# Patient Record
Sex: Male | Born: 1965 | Race: White | Hispanic: No | Marital: Married | Smoking: Never smoker
Health system: Southern US, Community
[De-identification: ages and names within clinical notes are randomized; demographics above are authoritative.]

## PROBLEM LIST (undated history)

## (undated) DIAGNOSIS — E079 Disorder of thyroid, unspecified: Secondary | ICD-10-CM

## (undated) DIAGNOSIS — F419 Anxiety disorder, unspecified: Secondary | ICD-10-CM

## (undated) DIAGNOSIS — K219 Gastro-esophageal reflux disease without esophagitis: Secondary | ICD-10-CM

## (undated) HISTORY — DX: Gastro-esophageal reflux disease without esophagitis: K21.9

## (undated) HISTORY — PX: NOSE SURGERY: SHX723

## (undated) HISTORY — PX: THYROID SURGERY: SHX805

## (undated) HISTORY — DX: Disorder of thyroid, unspecified: E07.9

## (undated) HISTORY — DX: Anxiety disorder, unspecified: F41.9

---

## 1987-08-22 HISTORY — PX: TONSILLECTOMY: SUR1361

## 2007-06-11 ENCOUNTER — Ambulatory Visit: Payer: Self-pay | Admitting: Otolaryngology

## 2007-06-12 ENCOUNTER — Ambulatory Visit: Payer: Self-pay | Admitting: Otolaryngology

## 2007-08-05 ENCOUNTER — Ambulatory Visit: Payer: Self-pay | Admitting: Otolaryngology

## 2007-09-30 ENCOUNTER — Ambulatory Visit: Payer: Self-pay | Admitting: Otolaryngology

## 2007-11-11 ENCOUNTER — Ambulatory Visit: Payer: Self-pay | Admitting: Otolaryngology

## 2007-11-15 ENCOUNTER — Ambulatory Visit: Payer: Self-pay | Admitting: Otolaryngology

## 2011-08-22 HISTORY — PX: KNEE ARTHROSCOPY: SHX127

## 2012-01-23 ENCOUNTER — Ambulatory Visit: Payer: Self-pay | Admitting: Sports Medicine

## 2012-02-29 ENCOUNTER — Ambulatory Visit: Payer: Self-pay | Admitting: Family Medicine

## 2012-03-08 ENCOUNTER — Ambulatory Visit: Payer: Self-pay | Admitting: General Practice

## 2013-09-28 ENCOUNTER — Ambulatory Visit: Payer: Self-pay | Admitting: Physician Assistant

## 2014-06-08 ENCOUNTER — Ambulatory Visit: Payer: Self-pay | Admitting: Family Medicine

## 2014-10-25 ENCOUNTER — Ambulatory Visit: Payer: Self-pay | Admitting: Emergency Medicine

## 2014-11-20 HISTORY — PX: SHOULDER SURGERY: SHX246

## 2014-12-08 NOTE — Op Note (Signed)
PATIENT NAME:  Timothy Hines, Timothy Hines MR#:  [id number] DATE OF BIRTH:  1965-09-02  DATE OF PROCEDURE:  03/08/2012  PREOPERATIVE DIAGNOSIS: Internal derangement of the left knee.   POSTOPERATIVE DIAGNOSES:  1. Tear of the posterior horn of medial meniscus, left knee.  2. 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   PROCEDURES PERFORMED: Left knee arthroscopy, partial medial meniscectomy, and chondroplasty of the medial and patellofemoral articulation.   SURGEON: Illene LabradorJames P. Hooten, MD  ANESTHESIA: General.   ESTIMATED BLOOD LOSS: Minimal.   TOURNIQUET TIME: Not used.   DRAINS: None.   INDICATIONS FOR SURGERY: The patient is a 49 year old male who has had complaints of left knee pain and swelling. MRI demonstrated findings suggestive of meniscal pathology. After discussion of the risks and benefits of surgical intervention, the patient expressed his understanding of the risks and benefits and agreed with plans for surgical intervention.   PROCEDURE IN DETAIL: The patient brought into the Operating Room and, after adequate general anesthesia was achieved, a tourniquet was placed on the patient's left thigh and the leg was placed in a leg holder. All bony prominences were well padded. The patient's left knee and leg were cleaned and prepped with alcohol and DuraPrep and draped in the usual sterile fashion. A "time out" was performed as per usual protocol. The anticipated portal sites were injected with 0.25% Marcaine with epinephrine. An anterolateral portal was created and cannula was inserted. A large effusion was evacuated. The scope was inserted and the knee was distended with fluid using the DePuy Mitek pump. The scope was advanced down the medial gutter into the medial compartment of the knee. Under visualization with the scope, an anteromedial portal was created and hook probe was inserted.  Inspection of the medial compartment demonstrated a small tear of the posterior horn medial meniscus. The tear was debrided using meniscal punches and a 4.5 mm shaver. Final contouring was performed using a 50 degree ArthroCare wand. The anterior horn of the medial meniscus was visualized and probed and felt to be stable. Articular surface along the distal aspect of the medial femoral condyle was in good condition as was the medial tibial plateau. However, with flexion of the knee there was noted to be an area of significant grade III chondromalacia. The area was debrided and contoured using the 50 degree ArthroCare wand. The scope was then advanced into the intercondylar region. The anterior cruciate ligament was visualized and probed and felt to be stable. The scope was removed from the anterolateral portal and reinserted via the anteromedial portal so as to better visualize the lateral compartment. The articular surface of the lateral compartment was in excellent condition. The lateral meniscus was visualized and probed and felt to be stable. Finally, the scope was positioned so as to visualize the patellofemoral articulation. Some grade III changes were noted along the trochlear groove with grade IV changes noted to the lateral facet of the patella. There was also noted to be an osteochondral loose body along the lateral margin of the patella. The articular surface was debrided and contoured using the ArthroCare wand. A pituitary rongeur was used to remove the loose body.   The knee was irrigated with copious amounts of fluid and then suctioned dry. The anterolateral portal was reapproximated using #3-0 nylon. A combination of 0.25% Marcaine with epinephrine and 4 mg of morphine was injected via the scope. The scope was removed and the anteromedial  portal was reapproximated using #3-0 nylon. Sterile dressing was applied followed by application of an ice wrap.   The patient tolerated the procedure well. He  was transported to the recovery room in stable condition.  ____________________________ Illene Labrador. Angie Fava., MD jph:slb D: 03/08/2012 12:41:28 ET     T: 03/08/2012 12:57:21 ET        JOB#: [id number] cc: Fayrene Fearing P. Angie Fava., MD, <Dictator> JAMES P Angie Fava MD ELECTRONICALLY SIGNED 03/10/2012 8:12

## 2015-07-01 ENCOUNTER — Encounter: Payer: Self-pay | Admitting: Family Medicine

## 2015-07-12 ENCOUNTER — Encounter: Payer: Self-pay | Admitting: Family Medicine

## 2015-07-12 ENCOUNTER — Ambulatory Visit (INDEPENDENT_AMBULATORY_CARE_PROVIDER_SITE_OTHER): Payer: BC Managed Care – PPO | Admitting: Family Medicine

## 2015-07-12 VITALS — BP 142/80 | HR 79 | Temp 98.6°F | Resp 16 | Ht 68.0 in | Wt 219.4 lb

## 2015-07-12 DIAGNOSIS — Z0000 Encounter for general adult medical examination without abnormal findings: Secondary | ICD-10-CM

## 2015-07-12 DIAGNOSIS — E039 Hypothyroidism, unspecified: Secondary | ICD-10-CM | POA: Diagnosis not present

## 2015-07-12 DIAGNOSIS — L989 Disorder of the skin and subcutaneous tissue, unspecified: Secondary | ICD-10-CM

## 2015-07-12 DIAGNOSIS — E890 Postprocedural hypothyroidism: Secondary | ICD-10-CM | POA: Insufficient documentation

## 2015-07-12 DIAGNOSIS — F419 Anxiety disorder, unspecified: Secondary | ICD-10-CM | POA: Diagnosis not present

## 2015-07-12 DIAGNOSIS — N529 Male erectile dysfunction, unspecified: Secondary | ICD-10-CM

## 2015-07-12 DIAGNOSIS — Z23 Encounter for immunization: Secondary | ICD-10-CM | POA: Diagnosis not present

## 2015-07-12 DIAGNOSIS — E291 Testicular hypofunction: Secondary | ICD-10-CM | POA: Diagnosis not present

## 2015-07-12 DIAGNOSIS — J40 Bronchitis, not specified as acute or chronic: Secondary | ICD-10-CM | POA: Diagnosis not present

## 2015-07-12 MED ORDER — AMOXICILLIN-POT CLAVULANATE 875-125 MG PO TABS: 1.0000 | ORAL_TABLET | Freq: Two times a day (BID) | ORAL | Status: DC

## 2015-07-12 MED ORDER — MELOXICAM 15 MG PO TABS: 15.0000 mg | ORAL_TABLET | Freq: Every day | ORAL | Status: DC

## 2015-07-12 MED ORDER — HYDROCOD POLST-CPM POLST ER 10-8 MG/5ML PO SUER: 5.0000 mL | Freq: Every evening | ORAL | Status: DC | PRN

## 2015-07-12 MED ORDER — ALPRAZOLAM 0.5 MG PO TABS: 0.5000 mg | ORAL_TABLET | Freq: Two times a day (BID) | ORAL | Status: DC | PRN

## 2015-07-12 MED ORDER — TESTOSTERONE CYPIONATE 200 MG/ML IM SOLN: 200.0000 mg | INTRAMUSCULAR | Status: DC

## 2015-07-12 MED ORDER — OMEPRAZOLE 40 MG PO CPDR: 40.0000 mg | DELAYED_RELEASE_CAPSULE | Freq: Every day | ORAL | Status: DC

## 2015-07-12 NOTE — Progress Notes (Signed)
Name: Timothy Hines   MRN: [id number]    DOB: 1965/10/10   Date:07/12/2015       Progress Note  Subjective  Chief Complaint  Chief Complaint  Patient presents with  . Annual Exam    HPI  Patient presents for annual H&P at the age of 89.   Hypogonadism  Patient has a long history of hypogonadism haven't after having used anabolic steroids during his years of muscle building much in the past. He currently is on testosterone cypionate on a regimen of approximately 100 g every other week.   ED  Patient on generic sildenafil 20 mg.. He takes on a when necessary basis and uses about 30 tabs per month.  Facial lesion  Patient complains of a lesion on his right forehead area which she seen for several months. Also several other pigmented lesions on his back.  Bronchitis  Patient presents with a 1/2-2 week history of cough productive of green sputum as well as nasal congestion and drainage. He caught it on was vacationing. He's had no fever or chills. Over-the-counter meds have not been effective. He hasn't Delsym during the day but still coughs throughout the night.  Anxiety  Patient takes half of a Xanax on nights when he has a long day at work. This is working effectively to relieve his anxiety and insomnia police long work days.Anxiety has been a problem for over 5 years. It is mainly manifested by insomnia and some sweaty palms and racing thoughts at times.  Hypothyroidism  Patient is being [facility] for his hypothyroidism is currently on levothyroxine 25 g.    Past Medical History  Diagnosis Date  . Anxiety   . GERD (gastroesophageal reflux disease)   . Thyroid disease     Social History  Substance Use Topics  . Smoking status: Never Smoker   . Smokeless tobacco: Not on file  . Alcohol Use: No     Current outpatient prescriptions:  .  ALPRAZolam (XANAX) 0.5 MG tablet, Take 0.5 mg by mouth., Disp: , Rfl:  .  meloxicam (MOBIC) 15 MG  tablet, Take 1 tablet (15 mg total) by mouth daily., Disp: 30 tablet, Rfl: 5 .  omeprazole (PRILOSEC) 40 MG capsule, Take 1 capsule (40 mg total) by mouth daily., Disp: 30 capsule, Rfl: 5 .  tadalafil (CIALIS) 20 MG tablet, Take 20 mg by mouth daily as needed for erectile dysfunction., Disp: , Rfl:  .  testosterone cypionate (DEPOTESTOSTERONE CYPIONATE) 200 MG/ML injection, Inject 200 mg into the muscle every 14 (fourteen) days. 3/4 cc q week, Disp: , Rfl:  .  levothyroxine (SYNTHROID, LEVOTHROID) 125 MCG tablet, , Disp: , Rfl:  .  levothyroxine (SYNTHROID, LEVOTHROID) 150 MCG tablet, , Disp: , Rfl:  .  mometasone (NASONEX) 50 MCG/ACT nasal spray, , Disp: , Rfl:   No Known Allergies  Review of Systems  Constitutional: Positive for malaise/fatigue. Negative for fever, chills and weight loss.  HENT: Positive for congestion and sore throat. Negative for hearing loss and tinnitus.   Eyes: Negative for blurred vision, double vision and redness.  Respiratory: Positive for cough and sputum production. Negative for hemoptysis and shortness of breath.   Cardiovascular: Negative for chest pain, palpitations, orthopnea, claudication and leg swelling.  Gastrointestinal: Positive for nausea. Negative for heartburn, vomiting, diarrhea, constipation and blood in stool.  Genitourinary: Negative for dysuria, urgency, frequency and hematuria.       Erectile dysfunction  Musculoskeletal: Positive for joint pain. Negative for  myalgias, back pain, falls and neck pain.  Skin: Positive for rash. Negative for itching.  Neurological: Negative for dizziness, tingling, tremors, focal weakness, seizures, loss of consciousness, weakness and headaches.  Endo/Heme/Allergies: Does not bruise/bleed easily.  Psychiatric/Behavioral: Negative for depression and substance abuse. The patient is nervous/anxious and has insomnia.      Objective  Filed Vitals:   07/12/15 1329  BP: 142/80  Pulse: 79  Temp: 98.6 F (37 C)   TempSrc: Oral  Resp: 16  Height:  (1.727 m)  Weight: 219 lb 6.4 oz (99.519 kg)  SpO2: 97%     Physical Exam  Constitutional: He is oriented to person, place, and time and well-developed, well-nourished, and in no distress.  HENT:  Erythematous nasal turbinates with slightly purulent discharge oropharynx is injected as well.  Eyes: EOM are normal. Pupils are equal, round, and reactive to light.  Neck: Normal range of motion. Neck supple. No thyromegaly present.  Cardiovascular: Normal rate, regular rhythm and normal heart sounds.   No murmur heard. Pulmonary/Chest: Effort normal and breath sounds normal. No respiratory distress. He has no wheezes.  Chest wall tenderness over the left pectoralis area site of former surgery. There's for range of motion shoulder joint  Abdominal: Soft. Bowel sounds are normal.  Genitourinary: Rectum normal, prostate normal and penis normal. Guaiac negative stool. No discharge found.  Musculoskeletal: Normal range of motion. He exhibits tenderness. He exhibits no edema.  Over the left shoulder and pectoralis area there's some mild tenderness  Lymphadenopathy:    He has no cervical adenopathy.  Neurological: He is alert and oriented to person, place, and time. No cranial nerve deficit. Gait normal. Coordination normal.  Skin: Skin is warm and dry. No rash noted.  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      Assessment & Plan  1. Annual physical exam  - CBC - Lipid panel  2. Hypothyroidism, unspecified hypothyroidism type  - PSA - Testosterone  3. Hypogonadism in male  - PSA - Testosterone  4. Acute anxiety Renew Xanax when necessary  5. Erectile dysfunction, unspecified erectile dysfunction type Renew sildenafil - PSA  6. Skin lesion of face Refer to dermatologist  7. Bronchitis Tussionex and  Augmentin  8. Need for influenza vaccination - Flu Vaccine QUAD 36+ mos PF IM (Fluarix & Fluzone Quad PF)

## 2015-07-13 LAB — CBC
HEMATOCRIT: 48 % (ref 37.5–51.0)
Hemoglobin: 16.5 g/dL (ref 12.6–17.7)
MCH: 30.8 pg (ref 26.6–33.0)
MCHC: 34.4 g/dL (ref 31.5–35.7)
MCV: 90 fL (ref 79–97)
PLATELETS: 272 10*3/uL (ref 150–379)
RBC: 5.36 x10E6/uL (ref 4.14–5.80)
RDW: 13.7 % (ref 12.3–15.4)
WBC: 4.5 10*3/uL (ref 3.4–10.8)

## 2015-07-14 LAB — LIPID PANEL
CHOL/HDL RATIO: 4.9 ratio (ref 0.0–5.0)
Cholesterol, Total: 230 mg/dL — ABNORMAL HIGH (ref 100–199)
HDL: 47 mg/dL (ref >39)
LDL CALC: 161 mg/dL — AB (ref 0–99)
TRIGLYCERIDES: 111 mg/dL (ref 0–149)
VLDL Cholesterol Cal: 22 mg/dL (ref 5–40)

## 2015-07-14 LAB — TESTOSTERONE: Testosterone: 373 ng/dL (ref 348–1197)

## 2015-07-14 LAB — PSA: Prostate Specific Ag, Serum: 1 ng/mL (ref 0.0–4.0)

## 2015-08-24 ENCOUNTER — Telehealth: Payer: Self-pay | Admitting: Family Medicine

## 2015-08-24 NOTE — Telephone Encounter (Signed)
Pt states he came in Nov for his CPE and has not received his results for his blood work. Please return his call.

## 2015-08-25 ENCOUNTER — Telehealth: Payer: Self-pay | Admitting: Family Medicine

## 2015-08-25 NOTE — Telephone Encounter (Signed)
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 PLEASE CALL AND GIVE HIM HIS RESULTS.

## 2015-08-26 ENCOUNTER — Encounter: Payer: Self-pay | Admitting: Family Medicine

## 2015-08-26 ENCOUNTER — Ambulatory Visit (INDEPENDENT_AMBULATORY_CARE_PROVIDER_SITE_OTHER): Payer: BC Managed Care – PPO | Admitting: Family Medicine

## 2015-08-26 VITALS — BP 112/72 | HR 78 | Temp 98.6°F | Resp 18 | Ht 68.0 in | Wt 215.5 lb

## 2015-08-26 DIAGNOSIS — J029 Acute pharyngitis, unspecified: Secondary | ICD-10-CM | POA: Diagnosis not present

## 2015-08-26 DIAGNOSIS — J0100 Acute maxillary sinusitis, unspecified: Secondary | ICD-10-CM

## 2015-08-26 DIAGNOSIS — J40 Bronchitis, not specified as acute or chronic: Secondary | ICD-10-CM

## 2015-08-26 LAB — POCT RAPID STREP A (OFFICE): RAPID STREP A SCREEN: NEGATIVE

## 2015-08-26 MED ORDER — DEXTROMETHORPHAN POLISTIREX ER 30 MG/5ML PO SUER: 30.0000 mg | Freq: Two times a day (BID) | ORAL | Status: DC

## 2015-08-26 MED ORDER — FLUTICASONE PROPIONATE 50 MCG/ACT NA SUSP: 2.0000 | Freq: Every day | NASAL | Status: DC

## 2015-08-26 MED ORDER — AMOXICILLIN-POT CLAVULANATE 875-125 MG PO TABS: 1.0000 | ORAL_TABLET | Freq: Two times a day (BID) | ORAL | Status: DC

## 2015-08-26 MED ORDER — PREDNISONE 20 MG PO TABS: 20.0000 mg | ORAL_TABLET | Freq: Every day | ORAL | Status: DC

## 2015-08-26 NOTE — Progress Notes (Signed)
Name: Timothy Hines   MRN: [id number]    DOB: Jan 11, 1966   Date:08/26/2015       Progress Note  Subjective  Chief Complaint  Chief Complaint  Patient presents with  . URI    for 1 week, cough, congested  . Sore Throat    HPI  Sinusitis  Patient presents with greater than 7 day history of nasal congestion and drainage which is purulent in color. There is tenderness over the sinuses. There has been fever to - along with some associated chills on occasion. Usage of over-the-counter medications is not been affected. There is also accompanying cough productive of purulent sputum.  Bronchitis  Patient presents with a greater than 1 week history of cough productive of purulent sputum. The cough is irritating and keep the patient awake at night. There has no fever or chills.  Over-the-counter meds And completely effective.  Pharyngitis  Several-day history of sore throat pain with cough and congestion. No definite exposure to strep but he does have small children at home.  Flulike syndrome  Several-day history of congestion cough and subjective fever chills myalgias. Has several small children at home  Past Medical History  Diagnosis Date  . Anxiety   . GERD (gastroesophageal reflux disease)   . Thyroid disease     Social History  Substance Use Topics  . Smoking status: Never Smoker   . Smokeless tobacco: Not on file  . Alcohol Use: No     Current outpatient prescriptions:  .  ALPRAZolam (XANAX) 0.5 MG tablet, Take 1 tablet (0.5 mg total) by mouth 2 (two) times daily as needed for anxiety., Disp: 60 tablet, Rfl: 5 .  chlorpheniramine-HYDROcodone (TUSSIONEX PENNKINETIC ER) 10-8 MG/5ML SUER, Take 5 mLs by mouth at bedtime as needed for cough., Disp: 140 mL, Rfl: 0 .  levothyroxine (SYNTHROID, LEVOTHROID) 125 MCG tablet, , Disp: , Rfl:  .  levothyroxine (SYNTHROID, LEVOTHROID) 150 MCG tablet, , Disp: , Rfl:  .  meloxicam (MOBIC) 15 MG tablet, Take 1 tablet (15 mg total)  by mouth daily., Disp: 30 tablet, Rfl: 5 .  mometasone (NASONEX) 50 MCG/ACT nasal spray, , Disp: , Rfl:  .  omeprazole (PRILOSEC) 40 MG capsule, Take 1 capsule (40 mg total) by mouth daily., Disp: 30 capsule, Rfl: 5 .  tadalafil (CIALIS) 20 MG tablet, Take 20 mg by mouth daily as needed for erectile dysfunction., Disp: , Rfl:  .  testosterone cypionate (DEPOTESTOSTERONE CYPIONATE) 200 MG/ML injection, Inject 1 mL (200 mg total) into the muscle every 14 (fourteen) days. 3/4 cc q week, Disp: 10 mL, Rfl: 5  No Known Allergies  Review of Systems  Constitutional: Positive for fever, chills and malaise/fatigue. Negative for weight loss.  HENT: Positive for congestion and sore throat. Negative for hearing loss and tinnitus.   Eyes: Negative for blurred vision, double vision and redness.  Respiratory: Positive for cough and sputum production. Negative for hemoptysis and shortness of breath.   Cardiovascular: Negative for chest pain, palpitations, orthopnea, claudication and leg swelling.  Gastrointestinal: Negative for heartburn, nausea, vomiting, diarrhea, constipation and blood in stool.  Genitourinary: Negative for dysuria, urgency, frequency and hematuria.  Musculoskeletal: Negative for myalgias, back pain, joint pain, falls and neck pain.  Skin: Negative for itching.  Neurological: Negative for dizziness, tingling, tremors, focal weakness, seizures, loss of consciousness, weakness and headaches.  Endo/Heme/Allergies: Does not bruise/bleed easily.  Psychiatric/Behavioral: Negative for depression and substance abuse. The patient is not nervous/anxious and does not have insomnia.  Objective  Filed Vitals:   08/26/15 1136  BP: 112/72  Pulse: 78  Temp: 98.6 F (37 C)  TempSrc: Oral  Resp: 18  Height: 5\' 8"  (1.727 m)  Weight: 215 lb 8 oz (97.75 kg)  SpO2: 97%     Physical Exam  Constitutional: He is oriented to person, place, and time and well-developed, well-nourished, and in no  distress.  HENT:  Head: Normocephalic.  Tender over the frontal maxillary sinuses with nasal turbinate swelling and erythema with purulent discharge pharynx minimally injected cervical lymph nodes are tender  Eyes: EOM are normal. Pupils are equal, round, and reactive to light.  Neck: Normal range of motion. Neck supple. No thyromegaly present.  Cardiovascular: Normal rate, regular rhythm and normal heart sounds.   No murmur heard. Pulmonary/Chest: Effort normal and breath sounds normal. No respiratory distress. He has no wheezes.  Abdominal: Soft. Bowel sounds are normal.  Musculoskeletal: Normal range of motion. He exhibits no edema.  Lymphadenopathy:    He has no cervical adenopathy.  Neurological: He is alert and oriented to person, place, and time. No cranial nerve deficit. Gait normal. Coordination normal.  Skin: Skin is warm and dry. No rash noted.  Psychiatric: Affect and judgment normal.      Assessment & Plan  1. Sore throat Probably secondary to nasal drainage from sinusitis - POCT rapid strep A  2. Bronchitis Persisting - amoxicillin-clavulanate (AUGMENTIN) 875-125 MG tablet; Take 1 tablet by mouth 2 (two) times daily.  Dispense: 20 tablet; Refill: 0 - predniSONE (DELTASONE) 20 MG tablet; Take 1 tablet (20 mg total) by mouth daily with breakfast.  Dispense: 10 tablet; Refill: 0 - dextromethorphan (DELSYM) 30 MG/5ML liquid; Take 5 mLs (30 mg total) by mouth 2 (two) times daily.  Dispense: 89 mL; Refill: 0  3. Acute maxillary sinusitis, recurrence not specified Moderate - amoxicillin-clavulanate (AUGMENTIN) 875-125 MG tablet; Take 1 tablet by mouth 2 (two) times daily.  Dispense: 20 tablet; Refill: 0 - predniSONE (DELTASONE) 20 MG tablet; Take 1 tablet (20 mg total) by mouth daily with breakfast.  Dispense: 10 tablet; Refill: 0 - fluticasone (FLONASE) 50 MCG/ACT nasal spray; Place 2 sprays into both nostrils daily.  Dispense: 16 g; Refill: 6

## 2015-08-26 NOTE — Telephone Encounter (Signed)
Patient notified of labs at appointment  

## 2015-08-27 ENCOUNTER — Ambulatory Visit: Payer: BC Managed Care – PPO | Admitting: Family Medicine

## 2015-12-31 ENCOUNTER — Encounter: Payer: Self-pay | Admitting: Emergency Medicine

## 2015-12-31 ENCOUNTER — Ambulatory Visit
Admission: EM | Admit: 2015-12-31 | Discharge: 2015-12-31 | Disposition: A | Discharge status: Home / Self Care | Payer: BC Managed Care – PPO | Attending: Family Medicine | Admitting: Family Medicine

## 2015-12-31 DIAGNOSIS — R05 Cough: Secondary | ICD-10-CM

## 2015-12-31 DIAGNOSIS — J0100 Acute maxillary sinusitis, unspecified: Secondary | ICD-10-CM

## 2015-12-31 DIAGNOSIS — J029 Acute pharyngitis, unspecified: Secondary | ICD-10-CM | POA: Diagnosis not present

## 2015-12-31 DIAGNOSIS — R059 Cough, unspecified: Secondary | ICD-10-CM

## 2015-12-31 LAB — RAPID STREP SCREEN (MED CTR MEBANE ONLY): STREPTOCOCCUS, GROUP A SCREEN (DIRECT): NEGATIVE

## 2015-12-31 MED ORDER — HYDROCOD POLST-CPM POLST ER 10-8 MG/5ML PO SUER: 5.0000 mL | Freq: Two times a day (BID) | ORAL | Status: DC | PRN

## 2015-12-31 MED ORDER — FEXOFENADINE-PSEUDOEPHED ER 180-240 MG PO TB24: 1.0000 | ORAL_TABLET | Freq: Every day | ORAL | Status: DC

## 2015-12-31 MED ORDER — CEFUROXIME AXETIL 500 MG PO TABS: 500.0000 mg | ORAL_TABLET | Freq: Two times a day (BID) | ORAL | Status: DC

## 2015-12-31 NOTE — ED Notes (Signed)
Patient c/o sore throat, left ear pain and cough for a week.

## 2015-12-31 NOTE — Discharge Instructions (Signed)
Pharyngitis Pharyngitis is a sore throat (pharynx). There is redness, pain, and swelling of your throat. HOME CARE   Drink enough fluids to keep your pee (urine) clear or pale yellow.  Only take medicine as told by your doctor.  You may get sick again if you do not take medicine as told. Finish your medicines, even if you start to feel better.  Do not take aspirin.  Rest.  Rinse your mouth (gargle) with salt water ( tsp of salt per 1 qt of water) every 1-2 hours. This will help the pain.  If you are not at risk for choking, you can suck on hard candy or sore throat lozenges. GET HELP IF:  You have large, tender lumps on your neck.  You have a rash.  You cough up green, yellow-brown, or bloody spit. GET HELP RIGHT AWAY IF:   You have a stiff neck.  You drool or cannot swallow liquids.  You throw up (vomit) or are not able to keep medicine or liquids down.  You have very bad pain that does not go away with medicine.  You have problems breathing (not from a stuffy nose). MAKE SURE YOU:   Understand these instructions.  Will watch your condition.  Will get help right away if you are not doing well or get worse.   This information is not intended to replace advice given to you by your health care provider. Make sure you discuss any questions you have with your health care provider.   Document Released: 01/24/2008 Document Revised: 05/28/2013 Document Reviewed: 04/14/2013 Elsevier Interactive Patient Education 2016 [employer]  Sinusitis, Adult Sinusitis is redness, soreness, and puffiness (inflammation) of the air pockets in the bones of your face (sinuses). The redness, soreness, and puffiness can cause air and mucus to get trapped in your sinuses. This can allow germs to grow and cause an infection.  HOME CARE   Drink enough fluids to keep your pee (urine) clear or pale yellow.  Use a humidifier in your home.  Run a hot shower to create steam in the bathroom.  Sit in the bathroom with the door closed. Breathe in the steam 3-4 times a day.  Put a warm, moist washcloth on your face 3-4 times a day, or as told by your doctor.  Use salt water sprays (saline sprays) to wet the thick fluid in your nose. This can help the sinuses drain.  Only take medicine as told by your doctor. GET HELP RIGHT AWAY IF:   Your pain gets worse.  You have very bad headaches.  You are sick to your stomach (nauseous).  You throw up (vomit).  You are very sleepy (drowsy) all the time.  Your face is puffy (swollen).  Your vision changes.  You have a stiff neck.  You have trouble breathing. MAKE SURE YOU:   Understand these instructions.  Will watch your condition.  Will get help right away if you are not doing well or get worse.   This information is not intended to replace advice given to you by your health care provider. Make sure you discuss any questions you have with your health care provider.   Document Released: 01/24/2008 Document Revised: 08/28/2014 Document Reviewed: 03/12/2012 Elsevier Interactive Patient Education 2016 [employer]  Cough, Adult A cough helps to clear your throat and lungs. A cough may last only 2-3 weeks (acute), or it may last longer than 8 weeks (chronic). Many different things can cause a cough. A cough may  be a sign of an illness or another medical condition. HOME CARE  Pay attention to any changes in your cough.  Take medicines only as told by your doctor.  If you were prescribed an antibiotic medicine, take it as told by your doctor. Do not stop taking it even if you start to feel better.  Talk with your doctor before you try using a cough medicine.  Drink enough fluid to keep your pee (urine) clear or pale yellow.  If the air is dry, use a cold steam vaporizer or humidifier in your home.  Stay away from things that make you cough at work or at home.  If your cough is worse at night, try using extra pillows  to raise your head up higher while you sleep.  Do not smoke, and try not to be around smoke. If you need help quitting, ask your doctor.  Do not have caffeine.  Do not drink alcohol.  Rest as needed. GET HELP IF:  You have new problems (symptoms).  You cough up yellow fluid (pus).  Your cough does not get better after 2-3 weeks, or your cough gets worse.  Medicine does not help your cough and you are not sleeping well.  You have pain that gets worse or pain that is not helped with medicine.  You have a fever.  You are losing weight and you do not know why.  You have night sweats. GET HELP RIGHT AWAY IF:  You cough up blood.  You have trouble breathing.  Your heartbeat is very fast.   This information is not intended to replace advice given to you by your health care provider. Make sure you discuss any questions you have with your health care provider.   Document Released: 04/20/2011 Document Revised: 04/28/2015 Document Reviewed: 10/14/2014 Elsevier Interactive Patient Education [employer]

## 2015-12-31 NOTE — ED Provider Notes (Signed)
CSN: [id number]     Arrival date & time 12/31/15  0818 History   First MD Initiated Contact with Patient 12/31/15 0848    Nurses notes were reviewed.  Chief Complaint  Patient presents with  . Sore Throat  . Otalgia  . Cough  Patient said because of nasal congestion and cough. He states that he's had a cough and nasal congestion for over a week last 2-3 days onset of sore throat and then this morning the sore throat got a lot worse. States the mucus build up in his throat that he felt like he was choking no chest pain but he did feel short of breath. States she's having thick mucus coughing up no particular color or consistency.   He had a prescription of Tussionex which she is used just about all out the past week cough. He does not smoke his history of anxiety GERD thyroid disease and about a year ago. Major shoulder surgery repair multiple tendon tears. Obviously he's very concerned about using quinolones in the he was not on quinolones we have tendon tears earlier  (Consider location/radiation/quality/duration/timing/severity/associated sxs/prior Treatment) Patient is a 50 y.o. male presenting with pharyngitis, ear pain, and cough. The history is provided by the patient. No language interpreter was used.  Sore Throat This is a new problem. The current episode started more than 1 week ago. The problem occurs constantly. The problem has been gradually worsening. Associated symptoms include shortness of breath. Pertinent negatives include no chest pain, no abdominal pain and no headaches. Nothing aggravates the symptoms. Nothing relieves the symptoms. Treatments tried: Tussionex and Nasonex. The treatment provided mild relief.  Otalgia Associated symptoms: cough   Associated symptoms: no abdominal pain and no headaches   Cough Associated symptoms: ear pain and shortness of breath   Associated symptoms: no chest pain and no headaches     Past Medical History  Diagnosis Date  . Anxiety   .  GERD (gastroesophageal reflux disease)   . Thyroid disease    Past Surgical History  Procedure Laterality Date  . Thyroid surgery    . Shoulder surgery Left april 2016   History reviewed. No pertinent family history. Social History  Substance Use Topics  . Smoking status: Never Smoker   . Smokeless tobacco: None  . Alcohol Use: 0.0 oz/week    0 Standard drinks or equivalent per week    Review of Systems  HENT: Positive for ear pain.   Respiratory: Positive for cough and shortness of breath.   Cardiovascular: Negative for chest pain.  Gastrointestinal: Negative for abdominal pain.  Neurological: Negative for headaches.  All other systems reviewed and are negative.   Allergies  Review of patient's allergies indicates no known allergies.  Home Medications   Prior to Admission medications   Medication Sig Start Date End Date Taking? Authorizing Provider  cetirizine (ZYRTEC) 10 MG tablet Take 10 mg by mouth daily.   Yes Historical Provider, MD  levothyroxine (SYNTHROID, LEVOTHROID) 200 MCG tablet Take 250 mcg by mouth daily before breakfast.   Yes Historical Provider, MD  methocarbamol (ROBAXIN) 500 MG tablet Take 500 mg by mouth every 6 (six) hours as needed for muscle spasms.   Yes Historical Provider, MD  ALPRAZolam Prudy Feeler) 0.5 MG tablet Take 1 tablet (0.5 mg total) by mouth 2 (two) times daily as needed for anxiety. 07/12/15   Dennison Mascot, MD  amoxicillin-clavulanate (AUGMENTIN) 875-125 MG tablet Take 1 tablet by mouth 2 (two) times daily. 08/26/15   Lemont  Morrisey, MD  cefUROXime (CEFTIN) 500 MG tablet Take 1 tablet (500 mg total) by mouth 2 (two) times daily. 12/31/15   Hassan RowanEugene Karey Suthers, MD  chlorpheniramine-HYDROcodone [facility](TUSSIONEX PENNKINETIC ER) 10-8 MG/5ML SUER Take 5 mLs by mouth at bedtime as needed for cough. 07/12/15   Dennison MascotLemont Morrisey, MD  chlorpheniramine-HYDROcodone (TUSSIONEX PENNKINETIC ER) 10-8 MG/5ML SUER Take 5 mLs by mouth every 12 (twelve) hours as needed for  cough. 12/31/15   Hassan RowanEugene Taeya Theall, MD  dextromethorphan (DELSYM) 30 MG/5ML liquid Take 5 mLs (30 mg total) by mouth 2 (two) times daily. 08/26/15   Dennison MascotLemont Morrisey, MD  fexofenadine-pseudoephedrine (ALLEGRA-D ALLERGY & CONGESTION) 180-240 MG 24 hr tablet Take 1 tablet by mouth daily. 12/31/15   Hassan RowanEugene Dontavian Marchi, MD  fluticasone (FLONASE) 50 MCG/ACT nasal spray Place 2 sprays into both nostrils daily. 08/26/15   Dennison MascotLemont Morrisey, MD  levothyroxine (SYNTHROID, LEVOTHROID) 125 MCG tablet  07/01/15   Historical Provider, MD  levothyroxine (SYNTHROID, LEVOTHROID) 150 MCG tablet  07/01/15   Historical Provider, MD  meloxicam (MOBIC) 15 MG tablet Take 1 tablet (15 mg total) by mouth daily. 07/12/15   Dennison MascotLemont Morrisey, MD  mometasone (NASONEX) 50 MCG/ACT nasal spray  04/22/15   Historical Provider, MD  omeprazole (PRILOSEC) 40 MG capsule Take 1 capsule (40 mg total) by mouth daily. 07/12/15   Dennison MascotLemont Morrisey, MD  predniSONE (DELTASONE) 20 MG tablet Take 1 tablet (20 mg total) by mouth daily with breakfast. 08/26/15   Dennison MascotLemont Morrisey, MD  tadalafil (CIALIS) 20 MG tablet Take 20 mg by mouth daily as needed for erectile dysfunction.    Historical Provider, MD  testosterone cypionate (DEPOTESTOSTERONE CYPIONATE) 200 MG/ML injection Inject 1 mL (200 mg total) into the muscle every 14 (fourteen) days. 3/4 cc q week 07/12/15   Dennison MascotLemont Morrisey, MD   Meds Ordered and Administered this Visit  Medications - No data to display  BP 130/94 mmHg  Pulse 77  Temp(Src) 97.2 F (36.2 C) (Tympanic)  Resp 16  Ht 5\' 10"  (1.778 m)  Wt 210 lb (95.255 kg)  BMI 30.13 kg/m2  SpO2 99% No data found.   Physical Exam  Constitutional: He is oriented to person, place, and time. He appears well-developed and well-nourished.  HENT:  Head: Normocephalic and atraumatic.  Right Ear: External ear normal.  Left Ear: External ear normal.  Mouth/Throat: Oropharynx is clear and moist.  Eyes: Conjunctivae are normal. Pupils are equal, round, and  reactive to light.  Neck: Normal range of motion. Neck supple. No tracheal deviation present. No thyromegaly present.  Cardiovascular: Normal rate and regular rhythm.   Pulmonary/Chest: Effort normal and breath sounds normal.  Musculoskeletal: Normal range of motion.  Neurological: He is alert and oriented to person, place, and time.  Skin: Skin is warm and dry.  Vitals reviewed.   ED Course  Procedures (including critical care time)  Labs Review Labs Reviewed  RAPID ST[facility]EN [facility])  CULTURE, GROUP A STREP [facility])    Imaging Review No results found.   Visual Acuity Review  Right Eye Distance:   Left Eye Distance:   Bilateral Distance:    Right Eye Near:   Left Eye Near:    Bilateral Near:      Results for orders placed or performed during the hospital encounter of 12/31/15  Rapid strep screen  Result Value Ref Range   Streptococcus, Group A Screen (Direct) NEGATIVE NEGATIVE    MDM   1. Acute maxillary sinusitis, recurrence not specified   2.  Cough   3. Allergic pharyngitis     We'll place patient on Ceftin 500 mg 1 tab twice a day Allegra-D since been going on now for 10 days. Continue using the Flonase at home and will renew his Tussionex prescription. Reassured is not being placed on a flouroquinolone at this time. Follow-up with PCP if not better in 1-2 weeks. He declines work note at this time.   Note: This dictation was prepared with Dragon dictation along with smaller phrase technology. Any transcriptional errors that result from this process are unintentional.  Hassan Rowan, MD 12/31/15 [phone]

## 2016-01-02 LAB — CULTURE, GROUP A STREP (THRC)

## 2016-01-10 ENCOUNTER — Ambulatory Visit: Payer: BC Managed Care – PPO | Admitting: Family Medicine

## 2016-01-18 ENCOUNTER — Ambulatory Visit (INDEPENDENT_AMBULATORY_CARE_PROVIDER_SITE_OTHER): Payer: BC Managed Care – PPO | Admitting: Family Medicine

## 2016-01-18 ENCOUNTER — Encounter: Payer: Self-pay | Admitting: Family Medicine

## 2016-01-18 VITALS — BP 127/79 | HR 80 | Temp 97.6°F | Resp 17 | Ht 70.0 in | Wt 221.5 lb

## 2016-01-18 DIAGNOSIS — M722 Plantar fascial fibromatosis: Secondary | ICD-10-CM | POA: Insufficient documentation

## 2016-01-18 DIAGNOSIS — J3089 Other allergic rhinitis: Secondary | ICD-10-CM

## 2016-01-18 MED ORDER — FLUTICASONE PROPIONATE 50 MCG/ACT NA SUSP: 2.0000 | Freq: Every day | NASAL | Status: AC

## 2016-01-18 MED ORDER — MELOXICAM 15 MG PO TABS: 15.0000 mg | ORAL_TABLET | Freq: Every day | ORAL | Status: DC

## 2016-01-18 NOTE — Progress Notes (Signed)
Name: Timothy Hines   MRN: [id number]    DOB: 09/03/1965   Date:01/18/2016       Progress Note  Subjective  Chief Complaint  Chief Complaint  Patient presents with  . Medication Refill    HPI  Right Foot Pain: Diagnosed with plantar fasciitis last November, started on Meloxicam which helps relieve his pain. He has increased his cardio activity and his foot pain is worse as a result..  Seasonal and Environmental ALlergies: Seasonal allergies especially in Spring, symptoms include congestion, sneezing, etc. Takes Zyrtec along with Flonase which helps relieve his symptoms.   Past Medical History  Diagnosis Date  . Anxiety   . GERD (gastroesophageal reflux disease)   . Thyroid disease     Past Surgical History  Procedure Laterality Date  . Thyroid surgery    . Shoulder surgery Left april 2016    History reviewed. No pertinent family history.  Social History   Social History  . Marital Status: Married    Spouse Name: N/A  . Number of Children: N/A  . Years of Education: N/A   Occupational History  . Not on file.   Social History Main Topics  . Smoking status: Never Smoker   . Smokeless tobacco: Not on file  . Alcohol Use: 0.0 oz/week    0 Standard drinks or equivalent per week  . Drug Use: No  . Sexual Activity:    Partners: Female   Other Topics Concern  . Not on file   Social History Narrative     Current outpatient prescriptions:  .  ALPRAZolam (XANAX) 0.5 MG tablet, Take 1 tablet (0.5 mg total) by mouth 2 (two) times daily as needed for anxiety., Disp: 60 tablet, Rfl: 5 .  amoxicillin-clavulanate (AUGMENTIN) 875-125 MG tablet, Take 1 tablet by mouth 2 (two) times daily., Disp: 20 tablet, Rfl: 0 .  cefUROXime (CEFTIN) 500 MG tablet, Take 1 tablet (500 mg total) by mouth 2 (two) times daily., Disp: 20 tablet, Rfl: 0 .  cetirizine (ZYRTEC) 10 MG tablet, Take 10 mg by mouth daily., Disp: , Rfl:  .  chlorpheniramine-HYDROcodone (TUSSIONEX PENNKINETIC  ER) 10-8 MG/5ML SUER, Take 5 mLs by mouth at bedtime as needed for cough., Disp: 140 mL, Rfl: 0 .  chlorpheniramine-HYDROcodone (TUSSIONEX PENNKINETIC ER) 10-8 MG/5ML SUER, Take 5 mLs by mouth every 12 (twelve) hours as needed for cough., Disp: 115 mL, Rfl: 0 .  dextromethorphan (DELSYM) 30 MG/5ML liquid, Take 5 mLs (30 mg total) by mouth 2 (two) times daily., Disp: 89 mL, Rfl: 0 .  fexofenadine-pseudoephedrine (ALLEGRA-D ALLERGY & CONGESTION) 180-240 MG 24 hr tablet, Take 1 tablet by mouth daily., Disp: 30 tablet, Rfl: 0 .  fluticasone (FLONASE) 50 MCG/ACT nasal spray, Place 2 sprays into both nostrils daily., Disp: 16 g, Rfl: 6 .  levothyroxine (SYNTHROID, LEVOTHROID) 125 MCG tablet, , Disp: , Rfl:  .  levothyroxine (SYNTHROID, LEVOTHROID) 150 MCG tablet, , Disp: , Rfl:  .  levothyroxine (SYNTHROID, LEVOTHROID) 200 MCG tablet, Take 250 mcg by mouth daily before breakfast., Disp: , Rfl:  .  meloxicam (MOBIC) 15 MG tablet, Take 1 tablet (15 mg total) by mouth daily., Disp: 30 tablet, Rfl: 5 .  methocarbamol (ROBAXIN) 500 MG tablet, Take 500 mg by mouth every 6 (six) hours as needed for muscle spasms., Disp: , Rfl:  .  mometasone (NASONEX) 50 MCG/ACT nasal spray, , Disp: , Rfl:  .  mupirocin ointment (BACTROBAN) 2 %, , Disp: , Rfl:  .  omeprazole (  PRILOSEC) 40 MG capsule, Take 1 capsule (40 mg total) by mouth daily., Disp: 30 capsule, Rfl: 5 .  tadalafil (CIALIS) 20 MG tablet, Take 20 mg by mouth daily as needed for erectile dysfunction., Disp: , Rfl:  .  testosterone cypionate (DEPOTESTOSTERONE CYPIONATE) 200 MG/ML injection, Inject 1 mL (200 mg total) into the muscle every 14 (fourteen) days. 3/4 cc q week, Disp: 10 mL, Rfl: 5 .  tobramycin (TOBREX) 0.3 % ophthalmic solution, , Disp: , Rfl: 0  No Known Allergies   Review of Systems  HENT: Positive for congestion.   Musculoskeletal: Positive for joint pain.     Objective  Filed Vitals:   01/18/16 1439  BP: 127/79  Pulse: 80  Temp:  97.6 F (36.4 C)  TempSrc: Oral  Resp: 17  Height:  (1.778 m)  Weight: 221 lb 8 oz (100.472 kg)  SpO2: 98%    Physical Exam  Constitutional: He is oriented to person, place, and time and well-developed, well-nourished, and in no distress.  HENT:  Head: Normocephalic and atraumatic.  Mouth/Throat: No posterior oropharyngeal erythema.  Nasal turbinate inflammation and hypertrophy.  Cardiovascular: Normal rate and regular rhythm.   Pulmonary/Chest: Effort normal and breath sounds normal.  Neurological: He is alert and oriented to person, place, and time.  Nursing note and vitals reviewed.     Assessment & Plan  1. Environmental and seasonal allergies Continue on Flonase for relief of allergic symptoms. - fluticasone (FLONASE) 50 MCG/ACT nasal spray; Place 2 sprays into both nostrils daily.  Dispense: 16 g; Refill: 6  2. Plantar fasciitis of right foot  - meloxicam (MOBIC) 15 MG tablet; Take 1 tablet (15 mg total) by mouth daily.  Dispense: 30 tablet; Refill: 5   Latavius Capizzi Asad [facility] 01/18/2016 2:47 PM

## 2016-04-13 ENCOUNTER — Telehealth: Payer: Self-pay | Admitting: Family Medicine

## 2016-04-14 NOTE — Telephone Encounter (Signed)
I have not provided or renewed any prescription of testosterone. His last lab work was in November 2016. As long as he is on testosterone replacement therapy, he should have lab work drawn every 3-4 months. Please schedule for an appointment for follow-up and lab work.

## 2016-04-17 NOTE — Telephone Encounter (Signed)
Patient has appointment for 04/18/16

## 2016-04-17 NOTE — Telephone Encounter (Signed)
Please close chart. Thank you °

## 2016-04-18 ENCOUNTER — Encounter: Payer: Self-pay | Admitting: Family Medicine

## 2016-04-18 ENCOUNTER — Ambulatory Visit (INDEPENDENT_AMBULATORY_CARE_PROVIDER_SITE_OTHER): Payer: BC Managed Care – PPO | Admitting: Family Medicine

## 2016-04-18 VITALS — BP 118/78 | HR 90 | Temp 98.7°F | Resp 17 | Ht 70.0 in | Wt 219.3 lb

## 2016-04-18 DIAGNOSIS — E291 Testicular hypofunction: Secondary | ICD-10-CM | POA: Diagnosis not present

## 2016-04-18 DIAGNOSIS — N529 Male erectile dysfunction, unspecified: Secondary | ICD-10-CM | POA: Diagnosis not present

## 2016-04-18 DIAGNOSIS — W57XXXA Bitten or stung by nonvenomous insect and other nonvenomous arthropods, initial encounter: Secondary | ICD-10-CM

## 2016-04-18 DIAGNOSIS — E785 Hyperlipidemia, unspecified: Secondary | ICD-10-CM | POA: Diagnosis not present

## 2016-04-18 DIAGNOSIS — S70362A Insect bite (nonvenomous), left thigh, initial encounter: Secondary | ICD-10-CM

## 2016-04-18 MED ORDER — TESTOSTERONE CYPIONATE 200 MG/ML IM SOLN: 200.0000 mg | INTRAMUSCULAR | 5 refills | Status: DC

## 2016-04-18 MED ORDER — TADALAFIL 20 MG PO TABS: 20.0000 mg | ORAL_TABLET | Freq: Every day | ORAL | 2 refills | Status: DC | PRN

## 2016-04-18 NOTE — Progress Notes (Signed)
Name: Timothy Hines   MRN: [id number]    DOB: [birth year]   Date:04/18/2016       Progress Note  Subjective  Chief Complaint  Chief Complaint  Patient presents with  . Medication Refill  . Labs Only  . Follow-up    testorone therapy    HPI  Testosterone Therapy: Pt. Is on testosterone replacement therapy for decrease in bone mass and low testosterone levels after he was started on thyroid replacement therapy following thyroid cancer. He is taking Testosterone cypionate 200 mg/mL 0.2375mL every week. He feels well, no abdominal pain, nausea, or fatigue.  Lab work done in November 2016 showed normal testosterone levels, normal PSA levels.   Past Medical History:  Diagnosis Date  . Anxiety   . GERD (gastroesophageal reflux disease)   . Thyroid disease     Past Surgical History:  Procedure Laterality Date  . SHOULDER SURGERY Left april 2016  . THYROID SURGERY      History reviewed. No pertinent family history.  Social History   Social History  . Marital status: Married    Spouse name: N/A  . Number of children: N/A  . Years of education: N/A   Occupational History  . Not on file.   Social History Main Topics  . Smoking status: Never Smoker  . Smokeless tobacco: Never Used  . Alcohol use 0.0 oz/week  . Drug use: No  . Sexual activity: Yes    Partners: Female   Other Topics Concern  . Not on file   Social History Narrative  . No narrative on file     Current Outpatient Prescriptions:  .  ALPRAZolam (XANAX) 0.5 MG tablet, Take 1 tablet (0.5 mg total) by mouth 2 (two) times daily as needed for anxiety., Disp: 60 tablet, Rfl: 5 .  cetirizine (ZYRTEC) 10 MG tablet, Take 10 mg by mouth daily., Disp: , Rfl:  .  fexofenadine-pseudoephedrine (ALLEGRA-D ALLERGY & CONGESTION) 180-240 MG 24 hr tablet, Take 1 tablet by mouth daily., Disp: 30 tablet, Rfl: 0 .  fluticasone (FLONASE) 50 MCG/ACT nasal spray, Place 2 sprays into both nostrils daily., Disp: 16 g, Rfl:  6 .  levothyroxine (SYNTHROID, LEVOTHROID) 125 MCG tablet, , Disp: , Rfl:  .  levothyroxine (SYNTHROID, LEVOTHROID) 200 MCG tablet, Take 250 mcg by mouth daily before breakfast., Disp: , Rfl:  .  meloxicam (MOBIC) 15 MG tablet, Take 1 tablet (15 mg total) by mouth daily., Disp: 30 tablet, Rfl: 5 .  methocarbamol (ROBAXIN) 500 MG tablet, Take 500 mg by mouth every 6 (six) hours as needed for muscle spasms., Disp: , Rfl:  .  mupirocin ointment (BACTROBAN) 2 %, , Disp: , Rfl:  .  omeprazole (PRILOSEC) 40 MG capsule, Take 1 capsule (40 mg total) by mouth daily., Disp: 30 capsule, Rfl: 5 .  tadalafil (CIALIS) 20 MG tablet, Take 20 mg by mouth daily as needed for erectile dysfunction., Disp: , Rfl:  .  testosterone cypionate (DEPOTESTOSTERONE CYPIONATE) 200 MG/ML injection, Inject 1 mL (200 mg total) into the muscle every 14 (fourteen) days. 3/4 cc q week, Disp: 10 mL, Rfl: 5 .  chlorpheniramine-HYDROcodone (TUSSIONEX PENNKINETIC ER) 10-8 MG/5ML SUER, Take 5 mLs by mouth every 12 (twelve) hours as needed for cough. (Patient not taking: Reported on 04/18/2016), Disp: 115 mL, Rfl: 0 .  levothyroxine (SYNTHROID, LEVOTHROID) 150 MCG tablet, , Disp: , Rfl:  .  mometasone (NASONEX) 50 MCG/ACT nasal spray, , Disp: , Rfl:  .  tobramycin (TOBREX) 0.3 %  ophthalmic solution, , Disp: , Rfl: 0  No Known Allergies   Review of Systems  Constitutional: Positive for malaise/fatigue (in the past 30 days, he has been bitten by 2 different ticks.). Negative for chills and fever.  Gastrointestinal: Negative for abdominal pain.  Genitourinary: Negative for dysuria, flank pain, frequency and hematuria.  Neurological: Positive for headaches.    Objective  Vitals:   04/18/16 1438  BP: 118/78  Pulse: 90  Resp: 17  Temp: 98.7 F (37.1 C)  TempSrc: Oral  SpO2: 97%  Weight: 219 lb 4.8 oz (99.5 kg)  Height: 5\' 10"  (1.778 m)    Physical Exam  Constitutional: He is oriented to person, place, and time and  well-developed, well-nourished, and in no distress.  HENT:  Head: Normocephalic and atraumatic.  Cardiovascular: Normal rate, regular rhythm, S1 normal and S2 normal.   No murmur heard. Pulmonary/Chest: Breath sounds normal. He has no wheezes. He has no rhonchi.  Abdominal: Soft. Bowel sounds are normal. There is no tenderness.  Musculoskeletal:       Right ankle: He exhibits no swelling.       Left ankle: He exhibits no swelling.  Neurological: He is alert and oriented to person, place, and time.  Psychiatric: Mood, memory, affect and judgment normal.  Nursing note and vitals reviewed.     Assessment & Plan  1. Hypogonadism in male Continue on testosterone replacement therapy as prescribed, recheck PSA and testosterone level - Testosterone - PSA - testosterone cypionate (DEPOTESTOSTERONE CYPIONATE) 200 MG/ML injection; Inject 1 mL (200 mg total) into the muscle every 14 (fourteen) days. 3/4 cc q week  Dispense: 10 mL; Refill: 5  2. Erectile dysfunction, unspecified erectile dysfunction type  - tadalafil (CIALIS) 20 MG tablet; Take 1 tablet (20 mg total) by mouth daily as needed for erectile dysfunction.  Dispense: 10 tablet; Refill: 2  3. Tick bite of left thigh, initial encounter Obtain serologies, CBC for white count. If symptoms of headache, malaise and fatigue persist, consider starting on prophylactic doxycycline. - HGE(IgG/M)+LymeAb(IgM)+RkyIgM - CBC with Differential - COMPLETE METABOLIC PANEL WITH GFR  4. Hyperlipidemia  - Lipid Profile   Lakoda Mcanany Asad [facility] 04/18/2016 2:52 PM

## 2016-04-19 ENCOUNTER — Telehealth: Payer: Self-pay | Admitting: Family Medicine

## 2016-04-19 DIAGNOSIS — W57XXXA Bitten or stung by nonvenomous insect and other nonvenomous arthropods, initial encounter: Secondary | ICD-10-CM

## 2016-04-19 LAB — LIPID PANEL
CHOL/HDL RATIO: 4.2 ratio (ref <=5.0)
Cholesterol: 203 mg/dL — ABNORMAL HIGH (ref 125–200)
HDL: 48 mg/dL (ref >=40)
LDL CALC: 135 mg/dL — AB (ref <130)
Triglycerides: 98 mg/dL (ref <150)
VLDL: 20 mg/dL (ref <30)

## 2016-04-19 LAB — CBC WITH DIFFERENTIAL/PLATELET
BASOS ABS: 34 {cells}/uL (ref 0–200)
Basophils Relative: 1 %
EOS PCT: 3 %
Eosinophils Absolute: 102 cells/uL (ref 15–500)
HCT: 49.5 % (ref 38.5–50.0)
HEMOGLOBIN: 16.8 g/dL (ref 13.2–17.1)
LYMPHS ABS: 1054 {cells}/uL (ref 850–3900)
Lymphocytes Relative: 31 %
MCH: 30.7 pg (ref 27.0–33.0)
MCHC: 33.9 g/dL (ref 32.0–36.0)
MCV: 90.5 fL (ref 80.0–100.0)
MONOS PCT: 11 %
MPV: 10.7 fL (ref 7.5–12.5)
Monocytes Absolute: 374 cells/uL (ref 200–950)
NEUTROS ABS: 1836 {cells}/uL (ref 1500–7800)
NEUTROS PCT: 54 %
PLATELETS: 283 10*3/uL (ref 140–400)
RBC: 5.47 MIL/uL (ref 4.20–5.80)
RDW: 13.7 % (ref 11.0–15.0)
WBC: 3.4 10*3/uL — AB (ref 3.8–10.8)

## 2016-04-19 LAB — COMPLETE METABOLIC PANEL WITH GFR
ALT: 32 U/L (ref 9–46)
AST: 32 U/L (ref 10–35)
Albumin: 4.5 g/dL (ref 3.6–5.1)
Alkaline Phosphatase: 36 U/L — ABNORMAL LOW (ref 40–115)
BUN: 18 mg/dL (ref 7–25)
CO2: 24 mmol/L (ref 20–31)
Calcium: 9.2 mg/dL (ref 8.6–10.3)
Chloride: 106 mmol/L (ref 98–110)
Creat: 1.02 mg/dL (ref 0.70–1.33)
GFR, EST NON AFRICAN AMERICAN: 85 mL/min (ref >=60)
GFR, Est African American: >89 mL/min (ref >=60)
GLUCOSE: 92 mg/dL (ref 65–99)
POTASSIUM: 4.9 mmol/L (ref 3.5–5.3)
SODIUM: 140 mmol/L (ref 135–146)
Total Bilirubin: 1.6 mg/dL — ABNORMAL HIGH (ref 0.2–1.2)
Total Protein: 6.8 g/dL (ref 6.1–8.1)

## 2016-04-19 LAB — PSA: PSA: 0.9 ng/mL (ref <=4.0)

## 2016-04-19 MED ORDER — DOXYCYCLINE HYCLATE 100 MG PO TABS: 100.0000 mg | ORAL_TABLET | Freq: Two times a day (BID) | ORAL | 0 refills | Status: DC

## 2016-04-19 NOTE — Telephone Encounter (Signed)
Patient had different take bites in the last month, serology testing is pending but patient is experiencing fatigue and headache, headache lasted the whole day today and patient is requesting an antibiotic for tickborne illnesses as discussed at office visit. We'll call in a prescription for doxycycline.

## 2016-04-20 LAB — TESTOSTERONE: TESTOSTERONE: 264 ng/dL (ref 250–827)

## 2016-05-02 ENCOUNTER — Encounter: Payer: Self-pay | Admitting: Family Medicine

## 2016-07-17 ENCOUNTER — Other Ambulatory Visit: Payer: Self-pay | Admitting: Family Medicine

## 2016-07-17 ENCOUNTER — Encounter: Payer: BC Managed Care – PPO | Admitting: Family Medicine

## 2016-07-18 ENCOUNTER — Ambulatory Visit (INDEPENDENT_AMBULATORY_CARE_PROVIDER_SITE_OTHER): Payer: BC Managed Care – PPO | Admitting: Internal Medicine

## 2016-07-18 ENCOUNTER — Encounter: Payer: Self-pay | Admitting: Internal Medicine

## 2016-07-18 VITALS — BP 130/96 | HR 85 | Temp 98.1°F | Resp 12 | Ht 69.5 in | Wt 222.8 lb

## 2016-07-18 DIAGNOSIS — E349 Endocrine disorder, unspecified: Secondary | ICD-10-CM

## 2016-07-18 DIAGNOSIS — J0120 Acute ethmoidal sinusitis, unspecified: Secondary | ICD-10-CM | POA: Diagnosis not present

## 2016-07-18 DIAGNOSIS — E559 Vitamin D deficiency, unspecified: Secondary | ICD-10-CM

## 2016-07-18 DIAGNOSIS — Z79899 Other long term (current) drug therapy: Secondary | ICD-10-CM | POA: Diagnosis not present

## 2016-07-18 DIAGNOSIS — E291 Testicular hypofunction: Secondary | ICD-10-CM

## 2016-07-18 DIAGNOSIS — E890 Postprocedural hypothyroidism: Secondary | ICD-10-CM

## 2016-07-18 DIAGNOSIS — B9789 Other viral agents as the cause of diseases classified elsewhere: Secondary | ICD-10-CM

## 2016-07-18 DIAGNOSIS — J069 Acute upper respiratory infection, unspecified: Secondary | ICD-10-CM

## 2016-07-18 DIAGNOSIS — E7800 Pure hypercholesterolemia, unspecified: Secondary | ICD-10-CM

## 2016-07-18 NOTE — Patient Instructions (Signed)
Continue taking 200 mg testosterone weekly for the next 3 weeks,  And 4000 Ius of  D3 as well   We will check Lipids,  D,  CMET.  CBC and testosyerone level in 3-weeks (just before your next testosterone injection)

## 2016-07-18 NOTE — Progress Notes (Signed)
Subjective:  Patient ID: Timothy Hines, male    DOB: [birth year]  Age: 50 y.o. MRN: [id number]  CC: The primary encounter diagnosis was Vitamin D deficiency. Diagnoses of Long-term use of high-risk medication, Pure hypercholesterolemia, Hypotestosteronemia, Acute ethmoidal sinusitis, recurrence not specified, Postsurgical hypothyroidism, Hypogonadism in male, and Viral URI with cough were also pertinent to this visit.     HPI Timothy Hines presents for establishment of care .  Caregiver fatigue:  Wife and patient have been caring for 50 yr old aunt who has suffered  right sided paralysis from a stroke.  Aunt has been having nocturnal Hallucinations.  Receiving hospice care and meds for hallucinations .  Thyroid surgery 2009 for CA .  TSH < 1 managed by Juengel  Osteopenia by DEXA  -2.3 hip.  Due to low testosterone , has been taking Testosterone replacement  Now with injections done Weekly for the past month..Has been givinghimself 150  Mg weekly  (3/4 ml) but for the past 3 week has been using 200 mg  Weekly  And feels much better .  Last level was very low.   Elevated blood pressure:  No history of HTN ,  normally runs 120-30/70 .  Has been taking a decongestant , last dose was this morning.     History Timothy Hines has a past medical history of Anxiety; GERD (gastroesophageal reflux disease); and Thyroid disease.   He has a past surgical history that includes Thyroid surgery; Shoulder surgery (Left, april 2016); Knee arthroscopy (Left, 2013); and Tonsillectomy (1989).   His family history is not on file.He reports that he has never smoked. He has never used smokeless tobacco. He reports that he drinks about 4.2 oz of alcohol per week . He reports that he does not use drugs.  Outpatient Medications Prior to Visit  Medication Sig Dispense Refill  . ALPRAZolam (XANAX) 0.5 MG tablet Take 1 tablet (0.5 mg total) by mouth 2 (two) times daily as needed for anxiety. 60 tablet 5    . cetirizine (ZYRTEC) 10 MG tablet Take 10 mg by mouth daily.    . chlorpheniramine-HYDROcodone (TUSSIONEX PENNKINETIC ER) 10-8 MG/5ML SUER Take 5 mLs by mouth every 12 (twelve) hours as needed for cough. 115 mL 0  . doxycycline (VIBRA-TABS) 100 MG tablet Take 1 tablet (100 mg total) by mouth 2 (two) times daily. 20 tablet 0  . fexofenadine-pseudoephedrine (ALLEGRA-D ALLERGY & CONGESTION) 180-240 MG 24 hr tablet Take 1 tablet by mouth daily. 30 tablet 0  . fluticasone (FLONASE) 50 MCG/ACT nasal spray Place 2 sprays into both nostrils daily. 16 g 6  . levothyroxine (SYNTHROID, LEVOTHROID) 125 MCG tablet Take 250 mcg by mouth daily before breakfast.     . meloxicam (MOBIC) 15 MG tablet Take 1 tablet (15 mg total) by mouth daily. 30 tablet 5  . methocarbamol (ROBAXIN) 500 MG tablet Take 500 mg by mouth every 6 (six) hours as needed for muscle spasms.    . mometasone (NASONEX) 50 MCG/ACT nasal spray     . mupirocin ointment (BACTROBAN) 2 %     . omeprazole (PRILOSEC) 40 MG capsule Take 1 capsule (40 mg total) by mouth daily. 30 capsule 5  . tadalafil (CIALIS) 20 MG tablet Take 1 tablet (20 mg total) by mouth daily as needed for erectile dysfunction. 10 tablet 2  . testosterone cypionate (DEPOTESTOSTERONE CYPIONATE) 200 MG/ML injection Inject 1 mL (200 mg total) into the muscle every 14 (fourteen) days. 3/4 cc q week 10 mL  5  . tobramycin (TOBREX) 0.3 % ophthalmic solution   0  . levothyroxine (SYNTHROID, LEVOTHROID) 150 MCG tablet     . levothyroxine (SYNTHROID, LEVOTHROID) 200 MCG tablet Take 250 mcg by mouth daily before breakfast.     No facility-administered medications prior to visit.     Review of Systems:  Patient denies headache, fevers, malaise, unintentional weight loss, skin rash, eye pain, sinus congestion and sinus pain, sore throat, dysphagia,  hemoptysis , cough, dyspnea, wheezing, chest pain, palpitations, orthopnea, edema, abdominal pain, nausea, melena, diarrhea, constipation,  flank pain, dysuria, hematuria, urinary  Frequency, nocturia, numbness, tingling, seizures,  Focal weakness, Loss of consciousness,  Tremor, insomnia, depression, anxiety, and suicidal ideation.     Objective:  BP (!) 130/96   Pulse 85   Temp 98.1 F (36.7 C) (Oral)   Resp 12   Ht 5' 9.5" (1.765 m)   Wt 222 lb 12 oz (101 kg)   SpO2 96%   BMI 32.42 kg/m   Physical Exam:   Assessment & Plan:   Problem List Items Addressed This Visit    Hyperlipidemia   Relevant Orders   Lipid panel   Hypogonadism in male    Found during workup for osteopenia.  Risks of high dose testosterone injections discussed with patient ;; he has experienced decreased QOL when his testosterone level is just above normal and feels better when serum level is about 600.  Continue 200 mg weekly for now since he has been taking it for 3 weeks,   Return in 3 weeks for level ,  Just before next dose.       Postsurgical hypothyroidism    Dr Elenore RotaJuengel is managing,  And keeping his TSH < 1      Viral URI with cough    URI is most likely viral given the mild HEENT  Symptoms  And normal exam.   I have explained that in viral URIS, an antibiotic will not help the symptoms and will increase the risk of developing diarrhea.,  Continue oral and nasal decongestants, cough suppressant , tylenol 650 mq 8 hrs for aches and pains, and prednisone  taper for inflammation.         Other Visit Diagnoses    Vitamin D deficiency    -  Primary   Relevant Orders   VITAMIN D 25 Hydroxy (Vit-D Deficiency, Fractures)   Long-term use of high-risk medication       Relevant Orders   Comprehensive metabolic panel   CBC with Differential/Platelet   Hypotestosteronemia       Relevant Orders   Testosterone, Free, Total, SHBG   Acute ethmoidal sinusitis, recurrence not specified       Relevant Medications   methylPREDNISolone acetate (DEPO-MEDROL) injection 40 mg (Completed)      I am having Timothy Hines maintain his levothyroxine,  mometasone, omeprazole, ALPRAZolam, cetirizine, methocarbamol, fexofenadine-pseudoephedrine, chlorpheniramine-HYDROcodone, mupirocin ointment, tobramycin, fluticasone, meloxicam, testosterone cypionate, tadalafil, and doxycycline. We administered methylPREDNISolone acetate.  Meds ordered this encounter  Medications  . methylPREDNISolone acetate (DEPO-MEDROL) injection 40 mg    Medications Discontinued During This Encounter  Medication Reason  . levothyroxine (SYNTHROID, LEVOTHROID) 150 MCG tablet Change in therapy  . levothyroxine (SYNTHROID, LEVOTHROID) 200 MCG tablet Change in therapy    Follow-up: Return fasting labs 4 weeks .   Sherlene ShamsULLO, Abygale Karpf L, MD

## 2016-07-18 NOTE — Progress Notes (Signed)
Pre-visit discussion using our clinic review tool. No additional management support is needed unless otherwise documented below in the visit note.  

## 2016-07-19 MED ORDER — METHYLPREDNISOLONE ACETATE 40 MG/ML IJ SUSP
40.0000 mg | Freq: Once | INTRAMUSCULAR | Status: AC
  Administered 2016-07-18: 40 mg via INTRAMUSCULAR

## 2016-07-21 ENCOUNTER — Encounter: Payer: Self-pay | Admitting: Internal Medicine

## 2016-07-21 DIAGNOSIS — J069 Acute upper respiratory infection, unspecified: Secondary | ICD-10-CM | POA: Insufficient documentation

## 2016-07-21 DIAGNOSIS — B9789 Other viral agents as the cause of diseases classified elsewhere: Secondary | ICD-10-CM

## 2016-07-21 NOTE — Assessment & Plan Note (Signed)
Dr Elenore RotaJuengel is managing,  And keeping his TSH < 1

## 2016-07-21 NOTE — Assessment & Plan Note (Signed)
Found during workup for osteopenia.  Risks of high dose testosterone injections discussed with patient ;; he has experienced decreased QOL when his testosterone level is just above normal and feels better when serum level is about 600.  Continue 200 mg weekly for now since he has been taking it for 3 weeks,   Return in 3 weeks for level ,  Just before next dose.

## 2016-07-21 NOTE — Assessment & Plan Note (Signed)
URI is most likely viral given the mild HEENT  Symptoms  And normal exam.   I have explained that in viral URIS, an antibiotic will not help the symptoms and will increase the risk of developing diarrhea.,  Continue oral and nasal decongestants, cough suppressant , tylenol 650 mq 8 hrs for aches and pains, and prednisone  taper for inflammation.

## 2016-08-08 ENCOUNTER — Other Ambulatory Visit (INDEPENDENT_AMBULATORY_CARE_PROVIDER_SITE_OTHER): Payer: BC Managed Care – PPO

## 2016-08-08 DIAGNOSIS — Z79899 Other long term (current) drug therapy: Secondary | ICD-10-CM

## 2016-08-08 DIAGNOSIS — E559 Vitamin D deficiency, unspecified: Secondary | ICD-10-CM | POA: Diagnosis not present

## 2016-08-08 DIAGNOSIS — E7800 Pure hypercholesterolemia, unspecified: Secondary | ICD-10-CM | POA: Diagnosis not present

## 2016-08-08 DIAGNOSIS — E349 Endocrine disorder, unspecified: Secondary | ICD-10-CM

## 2016-08-08 LAB — COMPREHENSIVE METABOLIC PANEL
ALBUMIN: 5 g/dL (ref 3.5–5.2)
ALK PHOS: 38 U/L — AB (ref 39–117)
ALT: 27 U/L (ref 0–53)
AST: 25 U/L (ref 0–37)
BILIRUBIN TOTAL: 2.1 mg/dL — AB (ref 0.2–1.2)
BUN: 18 mg/dL (ref 6–23)
CALCIUM: 9.4 mg/dL (ref 8.4–10.5)
CO2: 29 mEq/L (ref 19–32)
CREATININE: 1.08 mg/dL (ref 0.40–1.50)
Chloride: 101 mEq/L (ref 96–112)
GFR: 76.8 mL/min (ref >60.00)
Glucose, Bld: 99 mg/dL (ref 70–99)
Potassium: 4.4 mEq/L (ref 3.5–5.1)
SODIUM: 139 meq/L (ref 135–145)
TOTAL PROTEIN: 7.1 g/dL (ref 6.0–8.3)

## 2016-08-08 LAB — CBC WITH DIFFERENTIAL/PLATELET
BASOS ABS: 0 10*3/uL (ref 0.0–0.1)
Basophils Relative: 0.6 % (ref 0.0–3.0)
EOS ABS: 0.1 10*3/uL (ref 0.0–0.7)
Eosinophils Relative: 1 % (ref 0.0–5.0)
HCT: 50.7 % (ref 39.0–52.0)
HEMOGLOBIN: 17.3 g/dL — AB (ref 13.0–17.0)
LYMPHS ABS: 1.1 10*3/uL (ref 0.7–4.0)
Lymphocytes Relative: 20.3 % (ref 12.0–46.0)
MCHC: 34.2 g/dL (ref 30.0–36.0)
MCV: 92.9 fl (ref 78.0–100.0)
MONO ABS: 0.4 10*3/uL (ref 0.1–1.0)
Monocytes Relative: 7.9 % (ref 3.0–12.0)
NEUTROS PCT: 70.2 % (ref 43.0–77.0)
Neutro Abs: 3.7 10*3/uL (ref 1.4–7.7)
Platelets: 245 10*3/uL (ref 150.0–400.0)
RBC: 5.46 Mil/uL (ref 4.22–5.81)
RDW: 13.3 % (ref 11.5–15.5)
WBC: 5.3 10*3/uL (ref 4.0–10.5)

## 2016-08-08 LAB — LIPID PANEL
CHOLESTEROL: 199 mg/dL (ref 0–200)
HDL: 51.2 mg/dL (ref >39.00)
LDL Cholesterol: 133 mg/dL — ABNORMAL HIGH (ref 0–99)
NonHDL: 147.37
TRIGLYCERIDES: 74 mg/dL (ref 0.0–149.0)
Total CHOL/HDL Ratio: 4
VLDL: 14.8 mg/dL (ref 0.0–40.0)

## 2016-08-08 LAB — VITAMIN D 25 HYDROXY (VIT D DEFICIENCY, FRACTURES): VITD: 35.24 ng/mL (ref 30.00–100.00)

## 2016-08-10 ENCOUNTER — Encounter: Payer: Self-pay | Admitting: Internal Medicine

## 2016-08-11 ENCOUNTER — Encounter: Payer: Self-pay | Admitting: Internal Medicine

## 2016-08-11 ENCOUNTER — Other Ambulatory Visit: Payer: Self-pay | Admitting: Internal Medicine

## 2016-08-11 ENCOUNTER — Ambulatory Visit: Payer: BC Managed Care – PPO | Admitting: Internal Medicine

## 2016-08-11 DIAGNOSIS — E291 Testicular hypofunction: Secondary | ICD-10-CM

## 2016-08-11 DIAGNOSIS — D751 Secondary polycythemia: Secondary | ICD-10-CM

## 2016-08-11 LAB — TESTOS,TOTAL,FREE AND SHBG (FEMALE)
Sex Hormone Binding Glob.: 9 nmol/L — ABNORMAL LOW (ref 10–50)
TESTOSTERONE,TOTAL,LC/MS/MS: 1563 ng/dL — AB (ref 250–1100)
Testosterone, Free: 395.4 pg/mL — ABNORMAL HIGH (ref 35.0–155.0)

## 2016-08-11 MED ORDER — TESTOSTERONE CYPIONATE 200 MG/ML IM SOLN: 200.0000 mg | INTRAMUSCULAR | 5 refills | Status: DC

## 2016-08-12 ENCOUNTER — Other Ambulatory Visit: Payer: Self-pay | Admitting: Internal Medicine

## 2016-08-12 DIAGNOSIS — D582 Other hemoglobinopathies: Secondary | ICD-10-CM

## 2016-08-12 NOTE — Progress Notes (Signed)
ron

## 2016-09-04 DIAGNOSIS — D751 Secondary polycythemia: Secondary | ICD-10-CM | POA: Insufficient documentation

## 2016-09-04 NOTE — Progress Notes (Deleted)
[facility]  Telephone:([phone]) [phone] Fax:([phone]) [phone]  ID: Timothy Hines OB: 05-26-1966  MR#: [id number]  AOZ#:[id number]  Patient Care Team: Sherlene Shams, MD as PCP - General (Internal Medicine)  CHIEF COMPLAINT: Polycythemia, secondary.  INTERVAL HISTORY: ***  REVIEW OF SYSTEMS:   ROS  As per HPI. Otherwise, a complete review of systems is negative.  PAST MEDICAL HISTORY: Past Medical History:  Diagnosis Date  . Anxiety   . GERD (gastroesophageal reflux disease)   . Thyroid disease     PAST SURGICAL HISTORY: Past Surgical History:  Procedure Laterality Date  . KNEE ARTHROSCOPY Left 2013  . SHOULDER SURGERY Left april 2016  . THYROID SURGERY    . TONSILLECTOMY  1989    FAMILY HISTORY: No family history on file.  ADVANCED DIRECTIVES (Y/N):  N  HEALTH MAINTENANCE: Social History  Substance Use Topics  . Smoking status: Never Smoker  . Smokeless tobacco: Never Used  . Alcohol use 4.2 oz/week    7 Shots of liquor per week     Colonoscopy:  PAP:  Bone density:  Lipid panel:  No Known Allergies  Current Outpatient Prescriptions  Medication Sig Dispense Refill  . ALPRAZolam (XANAX) 0.5 MG tablet Take 1 tablet (0.5 mg total) by mouth 2 (two) times daily as needed for anxiety. 60 tablet 5  . cetirizine (ZYRTEC) 10 MG tablet Take 10 mg by mouth daily.    . chlorpheniramine-HYDROcodone (TUSSIONEX PENNKINETIC ER) 10-8 MG/5ML SUER Take 5 mLs by mouth every 12 (twelve) hours as needed for cough. 115 mL 0  . doxycycline (VIBRA-TABS) 100 MG tablet Take 1 tablet (100 mg total) by mouth 2 (two) times daily. 20 tablet 0  . fexofenadine-pseudoephedrine (ALLEGRA-D ALLERGY & CONGESTION) 180-240 MG 24 hr tablet Take 1 tablet by mouth daily. 30 tablet 0  . fluticasone (FLONASE) 50 MCG/ACT nasal spray Place 2 sprays into both nostrils daily. 16 g 6  . levothyroxine (SYNTHROID, LEVOTHROID) 125 MCG tablet Take 250 mcg by mouth daily before  breakfast.     . meloxicam (MOBIC) 15 MG tablet Take 1 tablet (15 mg total) by mouth daily. 30 tablet 5  . methocarbamol (ROBAXIN) 500 MG tablet Take 500 mg by mouth every 6 (six) hours as needed for muscle spasms.    . mometasone (NASONEX) 50 MCG/ACT nasal spray     . mupirocin ointment (BACTROBAN) 2 %     . omeprazole (PRILOSEC) 40 MG capsule Take 1 capsule (40 mg total) by mouth daily. 30 capsule 5  . tadalafil (CIALIS) 20 MG tablet Take 1 tablet (20 mg total) by mouth daily as needed for erectile dysfunction. 10 tablet 2  . testosterone cypionate (DEPOTESTOSTERONE CYPIONATE) 200 MG/ML injection Inject 1 mL (200 mg total) into the muscle every 14 (fourteen) days. 10 mL 5  . tobramycin (TOBREX) 0.3 % ophthalmic solution   0   No current facility-administered medications for this visit.     OBJECTIVE: There were no vitals filed for this visit.   There is no height or weight on file to calculate BMI.    ECOG FS:{CHL ONC [id number]  General: Well-developed, well-nourished, no acute distress. Eyes: Pink conjunctiva, anicteric sclera. HEENT: Normocephalic, moist mucous membranes, clear oropharnyx. Lungs: Clear to auscultation bilaterally. Heart: Regular rate and rhythm. No rubs, murmurs, or gallops. Abdomen: Soft, nontender, nondistended. No organomegaly noted, normoactive bowel sounds. Musculoskeletal: No edema, cyanosis, or clubbing. Neuro: Alert, answering all questions appropriately. Cranial nerves grossly intact. Skin: No rashes or petechiae  noted. Psych: Normal affect. Lymphatics: No cervical, calvicular, axillary or inguinal LAD.   LAB RESULTS:  Lab Results  Component Value Date   NA 139 08/08/2016   K 4.4 08/08/2016   CL 101 08/08/2016   CO2 29 08/08/2016   GLUCOSE 99 08/08/2016   BUN 18 08/08/2016   CREATININE 1.08 08/08/2016   CALCIUM 9.4 08/08/2016   PROT 7.1 08/08/2016   ALBUMIN 5.0 08/08/2016   AST 25 08/08/2016   ALT 27 08/08/2016   ALKPHOS 38 (L)  08/08/2016   BILITOT 2.1 (H) 08/08/2016   GFRNONAA 85 04/18/2016   GFRAA >89 04/18/2016    Lab Results  Component Value Date   WBC 5.3 08/08/2016   NEUTROABS 3.7 08/08/2016   HGB 17.3 (H) 08/08/2016   HCT 50.7 08/08/2016   MCV 92.9 08/08/2016   PLT 245.0 08/08/2016     STUDIES: No results found.  ASSESSMENT: Polycythemia, secondary.  PLAN:    1. Polycythemia, secondary:  Patient expressed understanding and was in agreement with this plan. He also understands that He can call clinic at any time with any questions, concerns, or complaints.   No matching staging information was found for the patient.  Jeralyn Ruthsimothy J Shadae Reino, MD   09/04/2016 10:55 PM

## 2016-09-07 ENCOUNTER — Inpatient Hospital Stay: Payer: BC Managed Care – PPO | Admitting: Oncology

## 2016-09-15 ENCOUNTER — Ambulatory Visit (INDEPENDENT_AMBULATORY_CARE_PROVIDER_SITE_OTHER): Payer: BC Managed Care – PPO | Admitting: Internal Medicine

## 2016-09-15 ENCOUNTER — Encounter: Payer: Self-pay | Admitting: Internal Medicine

## 2016-09-15 DIAGNOSIS — E291 Testicular hypofunction: Secondary | ICD-10-CM

## 2016-09-15 DIAGNOSIS — D751 Secondary polycythemia: Secondary | ICD-10-CM | POA: Diagnosis not present

## 2016-09-15 MED ORDER — METHOCARBAMOL 500 MG PO TABS: 500.0000 mg | ORAL_TABLET | Freq: Four times a day (QID) | ORAL | 3 refills | Status: DC

## 2016-09-15 NOTE — Patient Instructions (Signed)
Return for early mornin labs to recheck testosterone and CBC  Robaxin called in to [facility] for muscle spasms

## 2016-09-15 NOTE — Progress Notes (Signed)
Subjective:  Patient ID: Timothy Hines, male    DOB: [birth year]  Age: 51 y.o. MRN: [id number]  CC: Diagnoses of Polycythemia, secondary and Hypogonadism in male were pertinent to this visit.  HPI Timothy Hines presents for follow up on testosterone replacement due to ostenpenia, and polycythemia presumed secondary to high testosterone level in December.  Patient  has  reduced his weekly testosterone to biweekly, and feels no detrimental  change to his energy level or libido.   Aunt passed away, with hospice jan 3rd.   He and his wife took a 3 day weekend first since Sept. 2017  Left muscle strain after fall in icy [place] 5 days ago.    Outpatient Medications Prior to Visit  Medication Sig Dispense Refill  . ALPRAZolam (XANAX) 0.5 MG tablet Take 1 tablet (0.5 mg total) by mouth 2 (two) times daily as needed for anxiety. 60 tablet 5  . cetirizine (ZYRTEC) 10 MG tablet Take 10 mg by mouth daily.    . fluticasone (FLONASE) 50 MCG/ACT nasal spray Place 2 sprays into both nostrils daily. 16 g 6  . levothyroxine (SYNTHROID, LEVOTHROID) 125 MCG tablet Take 250 mcg by mouth daily before breakfast.     . meloxicam (MOBIC) 15 MG tablet Take 1 tablet (15 mg total) by mouth daily. 30 tablet 5  . omeprazole (PRILOSEC) 40 MG capsule Take 1 capsule (40 mg total) by mouth daily. 30 capsule 5  . tadalafil (CIALIS) 20 MG tablet Take 1 tablet (20 mg total) by mouth daily as needed for erectile dysfunction. 10 tablet 2  . testosterone cypionate (DEPOTESTOSTERONE CYPIONATE) 200 MG/ML injection Inject 1 mL (200 mg total) into the muscle every 14 (fourteen) days. 10 mL 5  . chlorpheniramine-HYDROcodone (TUSSIONEX PENNKINETIC ER) 10-8 MG/5ML SUER Take 5 mLs by mouth every 12 (twelve) hours as needed for cough. 115 mL 0  . doxycycline (VIBRA-TABS) 100 MG tablet Take 1 tablet (100 mg total) by mouth 2 (two) times daily. 20 tablet 0  . fexofenadine-pseudoephedrine (ALLEGRA-D ALLERGY & CONGESTION)  180-240 MG 24 hr tablet Take 1 tablet by mouth daily. 30 tablet 0  . methocarbamol (ROBAXIN) 500 MG tablet Take 500 mg by mouth every 6 (six) hours as needed for muscle spasms.    . mometasone (NASONEX) 50 MCG/ACT nasal spray     . mupirocin ointment (BACTROBAN) 2 %     . tobramycin (TOBREX) 0.3 % ophthalmic solution   0   No facility-administered medications prior to visit.     Review of Systems;  Patient denies headache, fevers, malaise, unintentional weight loss, skin rash, eye pain, sinus congestion and sinus pain, sore throat, dysphagia,  hemoptysis , cough, dyspnea, wheezing, chest pain, palpitations, orthopnea, edema, abdominal pain, nausea, melena, diarrhea, constipation, flank pain, dysuria, hematuria, urinary  Frequency, nocturia, numbness, tingling, seizures,  Focal weakness, Loss of consciousness,  Tremor, insomnia, depression, anxiety, and suicidal ideation.      Objective:  BP 112/84   Pulse 91   Temp 97.9 F (36.6 C) (Oral)   Resp 16   Ht 5\' 10"  (1.778 m)   Wt 220 lb (99.8 kg)   SpO2 95%   BMI 31.57 kg/m   BP Readings from Last 3 Encounters:  09/15/16 112/84  07/18/16 (!) 130/96  04/18/16 118/78    Wt Readings from Last 3 Encounters:  09/15/16 220 lb (99.8 kg)  07/18/16 222 lb 12 oz (101 kg)  04/18/16 219 lb 4.8 oz (99.5 kg)  General appearance: alert, cooperative and appears stated age Ears: normal TM's and external ear canals both ears Throat: lips, mucosa, and tongue normal; teeth and gums normal Neck: no adenopathy, no carotid bruit, supple, symmetrical, trachea midline and thyroid not enlarged, symmetric, no tenderness/mass/nodules Back: symmetric, no curvature. ROM normal. No CVA tenderness. Lungs: clear to auscultation bilaterally Heart: regular rate and rhythm, S1, S2 normal, no murmur, click, rub or gallop Abdomen: soft, non-tender; bowel sounds normal; no masses,  no organomegaly Pulses: 2+ and symmetric Skin: Skin color, texture, turgor  normal. No rashes or lesions Lymph nodes: Cervical, supraclavicular, and axillary nodes normal.  No results found for: HGBA1C  Lab Results  Component Value Date   CREATININE 1.08 08/08/2016   CREATININE 1.02 04/18/2016    Lab Results  Component Value Date   WBC 5.3 08/08/2016   HGB 17.3 (H) 08/08/2016   HCT 50.7 08/08/2016   PLT 245.0 08/08/2016   GLUCOSE 99 08/08/2016   CHOL 199 08/08/2016   TRIG 74.0 08/08/2016   HDL 51.20 08/08/2016   LDLCALC 133 (H) 08/08/2016   ALT 27 08/08/2016   AST 25 08/08/2016   NA 139 08/08/2016   K 4.4 08/08/2016   CL 101 08/08/2016   CREATININE 1.08 08/08/2016   BUN 18 08/08/2016   CO2 29 08/08/2016   PSA 0.9 04/18/2016    No results found.  Assessment & Plan:   Problem List Items Addressed This Visit    Hypogonadism in male    Dose reduced by 50% in December.  He will return for repeat levels midway between biweekly injections.  Lab Results  Component Value Date   TESTOSTERONE 264 04/18/2016         Polycythemia, secondary    LIKELY SECONDARY TO supra therapeutic testoserone level which has been addressed  With 50% reduction in dose.  He is returning for repeat levels, iron studies  and has hematology follow up  Lab Results  Component Value Date   WBC 5.3 08/08/2016   HGB 17.3 (H) 08/08/2016   HCT 50.7 08/08/2016   MCV 92.9 08/08/2016   PLT 245.0 08/08/2016   No results found for: IRON, TIBC, FERRITIN          I have discontinued Mr. Jr's mometasone, methocarbamol, fexofenadine-pseudoephedrine, chlorpheniramine-HYDROcodone, mupirocin ointment, tobramycin, and doxycycline. I am also having him start on methocarbamol. Additionally, I am having him maintain his levothyroxine, omeprazole, ALPRAZolam, cetirizine, fluticasone, meloxicam, tadalafil, testosterone cypionate, and sildenafil.  Meds ordered this encounter  Medications  . sildenafil (REVATIO) 20 MG tablet    Sig: Take 20 mg by mouth once a week.  .  methocarbamol (ROBAXIN) 500 MG tablet    Sig: Take 1 tablet (500 mg total) by mouth 4 (four) times daily.    Dispense:  90 tablet    Refill:  3    Medications Discontinued During This Encounter  Medication Reason  . chlorpheniramine-HYDROcodone (TUSSIONEX PENNKINETIC ER) 10-8 MG/5ML SUER Completed Course  . fexofenadine-pseudoephedrine (ALLEGRA-D ALLERGY & CONGESTION) 180-240 MG 24 hr tablet Completed Course  . doxycycline (VIBRA-TABS) 100 MG tablet Completed Course  . methocarbamol (ROBAXIN) 500 MG tablet Patient has not taken in last 30 days  . mometasone (NASONEX) 50 MCG/ACT nasal spray Change in therapy  . mupirocin ointment (BACTROBAN) 2 % Completed Course  . tobramycin (TOBREX) 0.3 % ophthalmic solution Completed Course    Follow-up: Return early am lab appt next week.   Sherlene ShamsULLO, Darneisha Windhorst L, MD

## 2016-09-17 NOTE — Assessment & Plan Note (Signed)
Dose reduced by 50% in December.  He will return for repeat levels midway between biweekly injections.  Lab Results  Component Value Date   TESTOSTERONE 264 04/18/2016

## 2016-09-17 NOTE — Assessment & Plan Note (Addendum)
LIKELY SECONDARY TO supra therapeutic testoserone level which has been addressed  With 50% reduction in dose.  He is returning for repeat levels, iron studies  and has hematology follow up  Lab Results  Component Value Date   WBC 5.3 08/08/2016   HGB 17.3 (H) 08/08/2016   HCT 50.7 08/08/2016   MCV 92.9 08/08/2016   PLT 245.0 08/08/2016   No results found for: IRON, TIBC, FERRITIN

## 2016-09-18 ENCOUNTER — Telehealth: Payer: Self-pay | Admitting: Internal Medicine

## 2016-09-18 ENCOUNTER — Telehealth: Payer: Self-pay | Admitting: Radiology

## 2016-09-18 NOTE — Telephone Encounter (Signed)
His testosterone order may need changing to new lab code  since this was ordered a while ago.Marland Kitchen.  Also needs to be done midway between his injections  (or slightly after midway)  , and in the early am.  Can you handle calling him and changing?  THIS IS THE CORRECT FILE.   Thanks!!

## 2016-09-18 NOTE — Telephone Encounter (Signed)
YES.  YOU'LL HAVE TO ASK HIM WHEN YOU SET IT UP

## 2016-09-18 NOTE — Telephone Encounter (Signed)
So would you like to see if pt could come in this week for labs? Pt is originally scheduled to come in on Feb 9th for labs. Not sure when last injection was.

## 2016-09-19 ENCOUNTER — Telehealth: Payer: Self-pay

## 2016-09-19 NOTE — Telephone Encounter (Signed)
LMOM for patient to call back about Immunization FLU

## 2016-09-19 NOTE — Telephone Encounter (Signed)
Called pt and left message to return call.

## 2016-09-20 NOTE — Telephone Encounter (Signed)
Pt scheduled for flu shot 

## 2016-09-24 NOTE — Progress Notes (Signed)
[facility]  Telephone:(336) [phone] [phone]  ID: Timothy Hines OB: 01-27-1966  MR#: [id number]  EXB#:[id number]#:[id number]  Patient Care Team: Sherlene Shamseresa L Tullo, MD as PCP - General (Internal Medicine)  CHIEF COMPLAINT: Polycythemia, secondary.  INTERVAL HISTORY: Patient is a 51 year old male who is undergoing treatment with testosterone injections was noted to have a slowly increasing hemoglobin. He currently feels well and is asymptomatic. He has no neurologic complaints. He denies any recent fevers or illnesses. He has a good appetite and denies weight loss. He has no chest pain or shortness of breath. He denies any nausea, vomiting, constipation, or diarrhea. He has no urinary complaints. Patient feels at his baseline and offers no specific complaints today.  REVIEW OF SYSTEMS:   Review of Systems  Constitutional: Negative.  Negative for fever, malaise/fatigue and weight loss.  Respiratory: Negative.  Negative for cough and shortness of breath.   Cardiovascular: Negative.  Negative for chest pain and leg swelling.  Gastrointestinal: Negative.  Negative for abdominal pain, blood in stool and melena.  Genitourinary: Negative.   Musculoskeletal: Negative.   Neurological: Negative.  Negative for sensory change and weakness.  Psychiatric/Behavioral: Negative.  The patient is not nervous/anxious.     As per HPI. Otherwise, a complete review of systems is negative.  PAST MEDICAL HISTORY: Past Medical History:  Diagnosis Date  . Anxiety   . GERD (gastroesophageal reflux disease)   . Thyroid disease     PAST SURGICAL HISTORY: Past Surgical History:  Procedure Laterality Date  . KNEE ARTHROSCOPY Left 2013  . SHOULDER SURGERY Left april 2016  . THYROID SURGERY    . TONSILLECTOMY  1989    FAMILY HISTORY: History reviewed. No pertinent family history.  ADVANCED DIRECTIVES (Y/N):  N  HEALTH MAINTENANCE: Social History  Substance Use Topics  .  Smoking status: Never Smoker  . Smokeless tobacco: Never Used  . Alcohol use 4.2 oz/week    7 Shots of liquor per week     Colonoscopy:  PAP:  Bone density:  Lipid panel:  No Known Allergies  Current Outpatient Prescriptions  Medication Sig Dispense Refill  . ALPRAZolam (XANAX) 0.5 MG tablet Take 1 tablet (0.5 mg total) by mouth 2 (two) times daily as needed for anxiety. 60 tablet 5  . cetirizine (ZYRTEC) 10 MG tablet Take 10 mg by mouth daily.    . fluticasone (FLONASE) 50 MCG/ACT nasal spray Place 2 sprays into both nostrils daily. 16 g 6  . levothyroxine (SYNTHROID, LEVOTHROID) 125 MCG tablet Take 250 mcg by mouth daily before breakfast.     . meloxicam (MOBIC) 15 MG tablet Take 1 tablet (15 mg total) by mouth daily. 30 tablet 5  . sildenafil (REVATIO) 20 MG tablet Take 20 mg by mouth once a week.    . testosterone cypionate (DEPOTESTOSTERONE CYPIONATE) 200 MG/ML injection Inject 1 mL (200 mg total) into the muscle every 14 (fourteen) days. 10 mL 5  . omeprazole (PRILOSEC) 40 MG capsule Take 1 capsule (40 mg total) by mouth daily. (Patient not taking: Reported on 09/25/2016) 30 capsule 5  . tadalafil (CIALIS) 20 MG tablet Take 1 tablet (20 mg total) by mouth daily as needed for erectile dysfunction. (Patient not taking: Reported on 09/25/2016) 10 tablet 2   No current facility-administered medications for this visit.     OBJECTIVE: Vitals:   09/25/16 1128  BP: (!) 159/87  Pulse: 79  Temp: 98.6 F (37 C)     Body mass index is 32.04  kg/m.    ECOG FS:0 - Asymptomatic  General: Well-developed, well-nourished, no acute distress. Eyes: Pink conjunctiva, anicteric sclera. HEENT: Normocephalic, moist mucous membranes, clear oropharnyx. Lungs: Clear to auscultation bilaterally. Heart: Regular rate and rhythm. No rubs, murmurs, or gallops. Abdomen: Soft, nontender, nondistended. No organomegaly noted, normoactive bowel sounds. Musculoskeletal: No edema, cyanosis, or  clubbing. Neuro: Alert, answering all questions appropriately. Cranial nerves grossly intact. Skin: No rashes or petechiae noted. Psych: Normal affect. Lymphatics: No cervical, calvicular, axillary or inguinal LAD.   LAB RESULTS:  Lab Results  Component Value Date   NA 139 08/08/2016   K 4.4 08/08/2016   CL 101 08/08/2016   CO2 29 08/08/2016   GLUCOSE 99 08/08/2016   BUN 18 08/08/2016   CREATININE 1.08 08/08/2016   CALCIUM 9.4 08/08/2016   PROT 7.1 08/08/2016   ALBUMIN 5.0 08/08/2016   AST 25 08/08/2016   ALT 27 08/08/2016   ALKPHOS 38 (L) 08/08/2016   BILITOT 2.1 (H) 08/08/2016   GFRNONAA 85 04/18/2016   GFRAA >89 04/18/2016    Lab Results  Component Value Date   WBC 2.9 (L) 09/25/2016   NEUTROABS 1.7 09/25/2016   HGB 16.9 09/25/2016   HCT 48.3 09/25/2016   MCV 89.8 09/25/2016   PLT 239 09/25/2016   Lab Results  Component Value Date   IRON 98 09/25/2016   TIBC 307 09/25/2016   IRONPCTSAT 32 09/25/2016   Lab Results  Component Value Date   FERRITIN 170 09/25/2016     STUDIES: No results found.  ASSESSMENT: Polycythemia, secondary.  PLAN:    1. Polycythemia, secondary: Likely secondary to testosterone use. Patient's iron stores are within normal limits. No need to check for JAK-2 mutation or hemachromatosis at this point, but would consider in the future if patient's hemoglobin remains elevated and he discontinues testosterone. Patient states he recently decreased his dose of testosterone and his hemoglobin has trended down from greater than 17 to currently 16.9. He does not require phlebotomy at this time. But if his hemoglobin trended back above 17.0, would consider some at that point. No intervention is needed at this time. Patient will return to clinic in 3 months with repeat laboratory work, further evaluation, consideration of phlebotomy. 2. Leukopenia: Mild, monitor. Repeat CBC with next blood draw.    Patient expressed understanding and was in  agreement with this plan. He also understands that He can call clinic at any time with any questions, concerns, or complaints.    Jeralyn Ruths, MD   09/27/2016 1:48 PM

## 2016-09-25 ENCOUNTER — Inpatient Hospital Stay: Payer: BC Managed Care – PPO | Attending: Oncology | Admitting: Oncology

## 2016-09-25 ENCOUNTER — Inpatient Hospital Stay: Payer: BC Managed Care – PPO

## 2016-09-25 ENCOUNTER — Encounter: Payer: Self-pay | Admitting: Oncology

## 2016-09-25 VITALS — BP 159/87 | HR 79 | Temp 98.6°F | Wt 223.3 lb

## 2016-09-25 DIAGNOSIS — E079 Disorder of thyroid, unspecified: Secondary | ICD-10-CM | POA: Diagnosis not present

## 2016-09-25 DIAGNOSIS — Z79899 Other long term (current) drug therapy: Secondary | ICD-10-CM | POA: Insufficient documentation

## 2016-09-25 DIAGNOSIS — F419 Anxiety disorder, unspecified: Secondary | ICD-10-CM | POA: Insufficient documentation

## 2016-09-25 DIAGNOSIS — D751 Secondary polycythemia: Secondary | ICD-10-CM | POA: Diagnosis present

## 2016-09-25 DIAGNOSIS — D72819 Decreased white blood cell count, unspecified: Secondary | ICD-10-CM

## 2016-09-25 DIAGNOSIS — K219 Gastro-esophageal reflux disease without esophagitis: Secondary | ICD-10-CM | POA: Insufficient documentation

## 2016-09-25 LAB — CBC WITH DIFFERENTIAL/PLATELET
BASOS ABS: 0.1 10*3/uL (ref 0–0.1)
Basophils Relative: 4 %
Eosinophils Absolute: 0 10*3/uL (ref 0–0.7)
Eosinophils Relative: 1 %
HEMATOCRIT: 48.3 % (ref 40.0–52.0)
Hemoglobin: 16.9 g/dL (ref 13.0–18.0)
LYMPHS PCT: 31 %
Lymphs Abs: 0.9 10*3/uL — ABNORMAL LOW (ref 1.0–3.6)
MCH: 31.3 pg (ref 26.0–34.0)
MCHC: 34.9 g/dL (ref 32.0–36.0)
MCV: 89.8 fL (ref 80.0–100.0)
MONO ABS: 0.2 10*3/uL (ref 0.2–1.0)
MONOS PCT: 8 %
NEUTROS ABS: 1.7 10*3/uL (ref 1.4–6.5)
Neutrophils Relative %: 56 %
PLATELETS: 239 10*3/uL (ref 150–440)
RBC: 5.38 MIL/uL (ref 4.40–5.90)
RDW: 12.6 % (ref 11.5–14.5)
WBC: 2.9 10*3/uL — ABNORMAL LOW (ref 3.8–10.6)

## 2016-09-25 LAB — IRON AND TIBC
Iron: 98 ug/dL (ref 45–182)
Saturation Ratios: 32 % (ref 17.9–39.5)
TIBC: 307 ug/dL (ref 250–450)
UIBC: 209 ug/dL

## 2016-09-25 LAB — FERRITIN: Ferritin: 170 ng/mL (ref 24–336)

## 2016-09-27 ENCOUNTER — Ambulatory Visit (INDEPENDENT_AMBULATORY_CARE_PROVIDER_SITE_OTHER): Payer: BC Managed Care – PPO

## 2016-09-27 DIAGNOSIS — Z23 Encounter for immunization: Secondary | ICD-10-CM | POA: Diagnosis not present

## 2016-09-29 ENCOUNTER — Other Ambulatory Visit (INDEPENDENT_AMBULATORY_CARE_PROVIDER_SITE_OTHER): Payer: BC Managed Care – PPO

## 2016-09-29 ENCOUNTER — Encounter: Payer: Self-pay | Admitting: Internal Medicine

## 2016-09-29 DIAGNOSIS — D582 Other hemoglobinopathies: Secondary | ICD-10-CM

## 2016-09-29 DIAGNOSIS — D751 Secondary polycythemia: Secondary | ICD-10-CM

## 2016-09-29 DIAGNOSIS — E291 Testicular hypofunction: Secondary | ICD-10-CM

## 2016-09-29 LAB — CBC WITH DIFFERENTIAL/PLATELET
BASOS ABS: 0.1 10*3/uL (ref 0.0–0.1)
Basophils Relative: 2.2 % (ref 0.0–3.0)
EOS ABS: 0 10*3/uL (ref 0.0–0.7)
Eosinophils Relative: 1.7 % (ref 0.0–5.0)
HCT: 50.1 % (ref 39.0–52.0)
HEMOGLOBIN: 17.3 g/dL — AB (ref 13.0–17.0)
LYMPHS PCT: 38.5 % (ref 12.0–46.0)
Lymphs Abs: 1 10*3/uL (ref 0.7–4.0)
MCHC: 34.6 g/dL (ref 30.0–36.0)
MCV: 91 fl (ref 78.0–100.0)
MONO ABS: 0.3 10*3/uL (ref 0.1–1.0)
Monocytes Relative: 11.9 % (ref 3.0–12.0)
Neutro Abs: 1.2 10*3/uL — ABNORMAL LOW (ref 1.4–7.7)
Neutrophils Relative %: 45.7 % (ref 43.0–77.0)
Platelets: 234 10*3/uL (ref 150.0–400.0)
RBC: 5.5 Mil/uL (ref 4.22–5.81)
RDW: 12.7 % (ref 11.5–15.5)
WBC: 2.7 10*3/uL — AB (ref 4.0–10.5)

## 2016-09-29 NOTE — Addendum Note (Signed)
Addended by: Warden FillersWRIGHT, Gerrod Maule S on: 09/29/2016 08:57 AM   Modules accepted: Orders

## 2016-09-30 LAB — IRON,TIBC AND FERRITIN PANEL
%SAT: 34 % (ref 15–60)
Ferritin: 227 ng/mL (ref 20–380)
IRON: 99 ug/dL (ref 50–180)
TIBC: 290 ug/dL (ref 250–425)

## 2016-10-03 ENCOUNTER — Encounter: Payer: Self-pay | Admitting: Internal Medicine

## 2016-10-03 LAB — TESTOS,TOTAL,FREE AND SHBG (FEMALE)
Sex Hormone Binding Glob.: 10 nmol/L (ref 10–50)
Testosterone, Free: 23.5 pg/mL — ABNORMAL LOW (ref 35.0–155.0)
Testosterone,Total,LC/MS/MS: 107 ng/dL — ABNORMAL LOW (ref 250–1100)

## 2016-10-04 ENCOUNTER — Other Ambulatory Visit: Payer: Self-pay | Admitting: Internal Medicine

## 2016-10-04 DIAGNOSIS — E291 Testicular hypofunction: Secondary | ICD-10-CM

## 2016-10-04 DIAGNOSIS — D751 Secondary polycythemia: Secondary | ICD-10-CM

## 2016-10-08 ENCOUNTER — Encounter: Payer: Self-pay | Admitting: Family Medicine

## 2016-11-13 ENCOUNTER — Other Ambulatory Visit: Payer: Self-pay | Admitting: Family Medicine

## 2016-11-13 DIAGNOSIS — E291 Testicular hypofunction: Secondary | ICD-10-CM

## 2016-11-16 ENCOUNTER — Other Ambulatory Visit: Payer: Self-pay

## 2016-12-06 ENCOUNTER — Encounter: Payer: Self-pay | Admitting: Internal Medicine

## 2016-12-07 ENCOUNTER — Encounter: Payer: Self-pay | Admitting: Internal Medicine

## 2016-12-08 ENCOUNTER — Other Ambulatory Visit: Payer: Self-pay | Admitting: Internal Medicine

## 2016-12-08 DIAGNOSIS — E291 Testicular hypofunction: Secondary | ICD-10-CM

## 2016-12-08 MED ORDER — TESTOSTERONE CYPIONATE 200 MG/ML IM SOLN: 200.0000 mg | INTRAMUSCULAR | 5 refills | Status: DC

## 2016-12-11 ENCOUNTER — Other Ambulatory Visit: Payer: Self-pay

## 2016-12-11 NOTE — Telephone Encounter (Signed)
Error

## 2016-12-25 ENCOUNTER — Inpatient Hospital Stay: Payer: BC Managed Care – PPO

## 2016-12-25 ENCOUNTER — Inpatient Hospital Stay: Payer: BC Managed Care – PPO | Admitting: Oncology

## 2017-02-14 ENCOUNTER — Other Ambulatory Visit: Payer: Self-pay | Admitting: Internal Medicine

## 2017-02-14 DIAGNOSIS — D751 Secondary polycythemia: Secondary | ICD-10-CM

## 2017-02-14 DIAGNOSIS — Z79899 Other long term (current) drug therapy: Secondary | ICD-10-CM

## 2017-02-14 DIAGNOSIS — E7800 Pure hypercholesterolemia, unspecified: Secondary | ICD-10-CM

## 2017-02-14 DIAGNOSIS — E291 Testicular hypofunction: Secondary | ICD-10-CM

## 2017-02-26 ENCOUNTER — Other Ambulatory Visit: Payer: Self-pay | Admitting: Internal Medicine

## 2017-05-29 ENCOUNTER — Telehealth: Payer: Self-pay | Admitting: Internal Medicine

## 2017-05-29 DIAGNOSIS — E785 Hyperlipidemia, unspecified: Secondary | ICD-10-CM

## 2017-05-29 NOTE — Telephone Encounter (Signed)
he is overdue to testosterone and other bloodwork  to be checked. (was ordered in June) plesae call and give him a lab appt. Needs to be fasting for a minimum of 3 hours

## 2017-05-30 NOTE — Telephone Encounter (Signed)
LMTCB

## 2017-06-04 ENCOUNTER — Other Ambulatory Visit (INDEPENDENT_AMBULATORY_CARE_PROVIDER_SITE_OTHER): Payer: BC Managed Care – PPO

## 2017-06-04 DIAGNOSIS — D751 Secondary polycythemia: Secondary | ICD-10-CM | POA: Diagnosis not present

## 2017-06-04 DIAGNOSIS — E785 Hyperlipidemia, unspecified: Secondary | ICD-10-CM | POA: Diagnosis not present

## 2017-06-04 DIAGNOSIS — Z79899 Other long term (current) drug therapy: Secondary | ICD-10-CM

## 2017-06-04 DIAGNOSIS — E291 Testicular hypofunction: Secondary | ICD-10-CM

## 2017-06-04 LAB — CBC WITH DIFFERENTIAL/PLATELET
BASOS PCT: 1.4 % (ref 0.0–3.0)
Basophils Absolute: 0 10*3/uL (ref 0.0–0.1)
EOS PCT: 2.3 % (ref 0.0–5.0)
Eosinophils Absolute: 0.1 10*3/uL (ref 0.0–0.7)
HEMATOCRIT: 49.4 % (ref 39.0–52.0)
HEMOGLOBIN: 17.2 g/dL — AB (ref 13.0–17.0)
LYMPHS PCT: 28.4 % (ref 12.0–46.0)
Lymphs Abs: 0.9 10*3/uL (ref 0.7–4.0)
MCHC: 34.7 g/dL (ref 30.0–36.0)
MCV: 93.7 fl (ref 78.0–100.0)
MONO ABS: 0.3 10*3/uL (ref 0.1–1.0)
Monocytes Relative: 10.3 % (ref 3.0–12.0)
NEUTROS ABS: 1.8 10*3/uL (ref 1.4–7.7)
Neutrophils Relative %: 57.6 % (ref 43.0–77.0)
Platelets: 253 10*3/uL (ref 150.0–400.0)
RBC: 5.27 Mil/uL (ref 4.22–5.81)
RDW: 13.3 % (ref 11.5–15.5)
WBC: 3.1 10*3/uL — AB (ref 4.0–10.5)

## 2017-06-04 LAB — COMPREHENSIVE METABOLIC PANEL
ALBUMIN: 4.5 g/dL (ref 3.5–5.2)
ALT: 23 U/L (ref 0–53)
AST: 28 U/L (ref 0–37)
Alkaline Phosphatase: 36 U/L — ABNORMAL LOW (ref 39–117)
BUN: 16 mg/dL (ref 6–23)
CHLORIDE: 103 meq/L (ref 96–112)
CO2: 27 meq/L (ref 19–32)
CREATININE: 1.1 mg/dL (ref 0.40–1.50)
Calcium: 9.1 mg/dL (ref 8.4–10.5)
GFR: 74.94 mL/min (ref >60.00)
Glucose, Bld: 101 mg/dL — ABNORMAL HIGH (ref 70–99)
POTASSIUM: 3.8 meq/L (ref 3.5–5.1)
SODIUM: 139 meq/L (ref 135–145)
Total Bilirubin: 2.3 mg/dL — ABNORMAL HIGH (ref 0.2–1.2)
Total Protein: 6.9 g/dL (ref 6.0–8.3)

## 2017-06-04 LAB — LIPID PANEL
CHOL/HDL RATIO: 4
Cholesterol: 191 mg/dL (ref 0–200)
HDL: 43.8 mg/dL (ref >39.00)
LDL Cholesterol: 133 mg/dL — ABNORMAL HIGH (ref 0–99)
NONHDL: 147.48
Triglycerides: 72 mg/dL (ref 0.0–149.0)
VLDL: 14.4 mg/dL (ref 0.0–40.0)

## 2017-06-04 NOTE — Telephone Encounter (Signed)
Spoke with pt and he stated that he came in this morning to have his labs drawn

## 2017-06-04 NOTE — Addendum Note (Signed)
Addended by: Penne Lash on: 06/04/2017 08:26 AM   Modules accepted: Orders

## 2017-06-05 ENCOUNTER — Encounter: Payer: Self-pay | Admitting: Internal Medicine

## 2017-06-05 ENCOUNTER — Other Ambulatory Visit: Payer: Self-pay | Admitting: Internal Medicine

## 2017-06-05 DIAGNOSIS — E291 Testicular hypofunction: Secondary | ICD-10-CM

## 2017-06-05 DIAGNOSIS — D751 Secondary polycythemia: Secondary | ICD-10-CM

## 2017-06-05 LAB — IRON,TIBC AND FERRITIN PANEL
%SAT: 71 % (calc) — ABNORMAL HIGH (ref 15–60)
Ferritin: 171 ng/mL (ref 20–380)
Iron: 202 ug/dL — ABNORMAL HIGH (ref 50–180)
TIBC: 286 ug/dL (ref 250–425)

## 2017-06-05 LAB — TESTOSTERONE TOTAL,FREE,BIO, MALES
Albumin: 4.6 g/dL (ref 3.6–5.1)
Sex Hormone Binding: 8 nmol/L — ABNORMAL LOW (ref 10–50)
TESTOSTERONE FREE: 262.1 pg/mL — AB (ref 46.0–224.0)
Testosterone, Bioavailable: 550.3 ng/dL (ref >=110.0)
Testosterone: 770 ng/dL (ref 250–827)

## 2017-06-05 MED ORDER — TESTOSTERONE CYPIONATE 200 MG/ML IM SOLN: 175.0000 mg | INTRAMUSCULAR | 5 refills | Status: DC

## 2017-06-05 NOTE — Assessment & Plan Note (Signed)
hgb is stabel at 17.2  Reducing testosterone dose to 175 mg  Lab Results  Component Value Date   WBC 3.1 (L) 06/04/2017   HGB 17.2 (H) 06/04/2017   HCT 49.4 06/04/2017   MCV 93.7 06/04/2017   PLT 253.0 06/04/2017   Lab Results  Component Value Date   TESTOSTERONE 770 06/04/2017

## 2017-09-18 ENCOUNTER — Other Ambulatory Visit: Payer: Self-pay | Admitting: Internal Medicine

## 2017-09-18 ENCOUNTER — Telehealth: Payer: Self-pay | Admitting: Internal Medicine

## 2017-09-18 DIAGNOSIS — N410 Acute prostatitis: Secondary | ICD-10-CM

## 2017-09-18 NOTE — Telephone Encounter (Signed)
Patient coming in to drop off a urine specimen Wednesday  morning . Symptoms: suprapubic pain, strong odor.  Labs ordered.  Thank you

## 2017-09-18 NOTE — Progress Notes (Signed)
Patient coming in to drop off a urine specimen. In the morning . Symptoms: suprapubic pain, strong odor.

## 2017-09-19 ENCOUNTER — Other Ambulatory Visit: Payer: Self-pay | Admitting: Internal Medicine

## 2017-09-19 ENCOUNTER — Encounter: Payer: Self-pay | Admitting: Internal Medicine

## 2017-09-19 ENCOUNTER — Other Ambulatory Visit (INDEPENDENT_AMBULATORY_CARE_PROVIDER_SITE_OTHER): Payer: BC Managed Care – PPO

## 2017-09-19 DIAGNOSIS — N410 Acute prostatitis: Secondary | ICD-10-CM | POA: Diagnosis not present

## 2017-09-19 LAB — URINALYSIS, ROUTINE W REFLEX MICROSCOPIC
Bilirubin Urine: NEGATIVE
KETONES UR: NEGATIVE
Nitrite: NEGATIVE
SPECIFIC GRAVITY, URINE: 1.01 (ref 1.000–1.030)
TOTAL PROTEIN, URINE-UPE24: NEGATIVE
URINE GLUCOSE: NEGATIVE
UROBILINOGEN UA: 0.2 (ref 0.0–1.0)
pH: 7 (ref 5.0–8.0)

## 2017-09-19 MED ORDER — SULFAMETHOXAZOLE-TRIMETHOPRIM 800-160 MG PO TABS: 1.0000 | ORAL_TABLET | Freq: Two times a day (BID) | ORAL | 0 refills | Status: DC

## 2017-09-19 NOTE — Progress Notes (Unsigned)
Treating for UTI/possible prostatitis empirically with Septra DS for 2 weeks pending culture results.

## 2017-09-21 ENCOUNTER — Telehealth: Payer: Self-pay | Admitting: Internal Medicine

## 2017-09-21 ENCOUNTER — Encounter: Payer: Self-pay | Admitting: Emergency Medicine

## 2017-09-21 ENCOUNTER — Emergency Department
Admission: EM | Admit: 2017-09-21 | Discharge: 2017-09-21 | Disposition: A | Discharge status: Home / Self Care | Payer: BC Managed Care – PPO | Attending: Emergency Medicine | Admitting: Emergency Medicine

## 2017-09-21 ENCOUNTER — Other Ambulatory Visit: Payer: Self-pay | Admitting: Internal Medicine

## 2017-09-21 ENCOUNTER — Other Ambulatory Visit: Payer: Self-pay

## 2017-09-21 DIAGNOSIS — N410 Acute prostatitis: Secondary | ICD-10-CM | POA: Insufficient documentation

## 2017-09-21 DIAGNOSIS — R339 Retention of urine, unspecified: Secondary | ICD-10-CM | POA: Diagnosis not present

## 2017-09-21 DIAGNOSIS — Z79899 Other long term (current) drug therapy: Secondary | ICD-10-CM | POA: Diagnosis not present

## 2017-09-21 DIAGNOSIS — E039 Hypothyroidism, unspecified: Secondary | ICD-10-CM | POA: Insufficient documentation

## 2017-09-21 DIAGNOSIS — N390 Urinary tract infection, site not specified: Secondary | ICD-10-CM | POA: Insufficient documentation

## 2017-09-21 LAB — URINE CULTURE
MICRO NUMBER:: 90127890
SPECIMEN QUALITY: ADEQUATE

## 2017-09-21 MED ORDER — CIPROFLOXACIN HCL 500 MG PO TABS: 500.0000 mg | ORAL_TABLET | Freq: Two times a day (BID) | ORAL | 0 refills | Status: DC

## 2017-09-21 NOTE — ED Provider Notes (Signed)
[facility] Emergency Department Provider Note   ____________________________________________   I have reviewed the triage vital signs and the nursing notes.   HISTORY  Chief Complaint Urinary Retention   History limited by: Not Limited   HPI Timothy Hines is a 52 y.o. male who presents to the emergency department today because of concerns for urinary retention.  The patient states that he has been in contact with his doctor for treatment for urinary tract infection.  He states his symptoms started 3 days ago.  He states that he was started on Bactrim by his primary care doctor for urinary tract infection and did initially feel better.  The patient states that today however he started having difficulty with urination. Started around 230 am when he could not use the restroom. This continued throughout the morning and early afternoon. The patient tried taking a hot shower and was able to get a little bit of urine out. He has had some associated lower back pain. Denies similar symptoms in the past.   Per medical record review patient has a history of recent UTI diagnosis. Positive urine culture.   Past Medical History:  Diagnosis Date  . Anxiety   . GERD (gastroesophageal reflux disease)   . Thyroid disease     Patient Active Problem List   Diagnosis Date Noted  . Prostatitis, acute 09/21/2017  . Polycythemia, secondary 09/04/2016  . Hyperlipidemia 04/18/2016  . Environmental and seasonal allergies 01/18/2016  . Plantar fasciitis of right foot 01/18/2016  . 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 Hypogonadism in male 07/12/2015  . Acute anxiety 07/12/2015  . Erectile dysfunction 07/12/2015    Past Surgical History:  Procedure Laterality Date  . KNEE ARTHROSCOPY Left 2013  . SHOULDER SURGERY Left april 2016  . THYROID SURGERY    . TONSILLECTOMY  1989    Prior to Admission medications   Medication Sig Start Date End Date Taking?  Authorizing Provider  ALPRAZolam Prudy Feeler(XANAX) 0.5 MG tablet Take 1 tablet (0.5 mg total) by mouth 2 (two) times daily as needed for anxiety. 07/12/15   Dennison MascotMorrisey, [place], MD  cetirizine (ZYRTEC) 10 MG tablet Take 10 mg by mouth daily.    [provider]  ciprofloxacin (CIPRO) 500 MG tablet Take 1 tablet (500 mg total) by mouth 2 (two) times daily. 09/21/17   Sherlene Shamsullo, Teresa L, MD  fluticasone (FLONASE) 50 MCG/ACT nasal spray Place 2 sprays into both nostrils daily. 01/18/16   Ellyn HackShah, Syed Asad A, MD  levothyroxine (SYNTHROID, LEVOTHROID) 125 MCG tablet Take 250 mcg by mouth daily before breakfast.  07/01/15   [provider]  meloxicam (MOBIC) 15 MG tablet Take 1 tablet (15 mg total) by mouth daily. 01/18/16   Ellyn HackShah, Syed Asad A, MD  sildenafil (REVATIO) 20 MG tablet Take 20 mg by mouth once a week.    [provider]  sulfamethoxazole-trimethoprim (BACTRIM DS,SEPTRA DS) 800-160 MG tablet Take 1 tablet by mouth 2 (two) times daily. 09/19/17   Sherlene Shamsullo, Teresa L, MD  testosterone cypionate (DEPOTESTOSTERONE CYPIONATE) 200 MG/ML injection Inject 0.88 mLs (175 mg total) into the muscle every 14 (fourteen) days. 06/05/17   Sherlene Shamsullo, Teresa L, MD    Allergies Patient has no known allergies.  No family history on file.  Social History Social History   Tobacco Use  . Smoking status: Never Smoker  . Smokeless tobacco: Never Used  Substance Use Topics  . Alcohol use: Yes    Alcohol/week: 4.2 oz    Types: 7  Shots of liquor per week  . Drug use: No    Review of Systems Constitutional: No fever/chills Eyes: No visual changes. ENT: No sore throat. Cardiovascular: Denies chest pain. Respiratory: Denies shortness of breath. Gastrointestinal: Positive for lower abdominal pain. Genitourinary: Positive for urinary retention. Musculoskeletal: Positive for lower abdominal pain.  Skin: Negative for rash. Neurological: Negative for headaches, focal weakness or  numbness.  ____________________________________________   PHYSICAL EXAM:  VITAL SIGNS: ED Triage Vitals [09/21/17 1420]  Enc Vitals Group     BP (!) 149/99     Pulse Rate (!) 116     Resp 16     Temp      Temp src      SpO2 97 %     Weight 220 lb (99.8 kg)     Height 5\' 10"  (1.778 m)     Head Circumference      Peak Flow      Pain Score 7   Constitutional: Alert and oriented. Well appearing and in no distress. Eyes: Conjunctivae are normal.  ENT   Head: Normocephalic and atraumatic.   Nose: No congestion/rhinnorhea.   Mouth/Throat: Mucous membranes are moist.   Neck: No stridor. Hematological/Lymphatic/Immunilogical: No cervical lymphadenopathy. Cardiovascular: Normal rate, regular rhythm.  No murmurs, rubs, or gallops.  Respiratory: Normal respiratory effort without tachypnea nor retractions. Breath sounds are clear and equal bilaterally. No wheezes/rales/rhonchi. Gastrointestinal: Soft and non tender. No rebound. No guarding.  Rectal: Firm, smooth prostate. Non tender. Not boggy.  Musculoskeletal: Normal range of motion in all extremities. No lower extremity edema. Neurologic:  Normal speech and language. No gross focal neurologic deficits are appreciated.  Skin:  Skin is warm, dry and intact. No rash noted. Psychiatric: Mood and affect are normal. Speech and behavior are normal. Patient exhibits appropriate insight and judgment.  ____________________________________________    LABS (pertinent positives/negatives)  None  ____________________________________________   EKG  None  ____________________________________________    RADIOLOGY  None  ____________________________________________   PROCEDURES  Procedures  ____________________________________________   INITIAL IMPRESSION / ASSESSMENT AND PLAN / ED COURSE  Pertinent labs & imaging results that were available during my care of the patient were reviewed by me and considered in my  medical decision making (see chart for details).  Patient presented to the emergency department today because of concerns for urinary retention.  Patient had a Foley catheter placed and felt immediate improvement.  Patient has known urinary tract infection with culture positive urine noted on chart review.  Prostate exam showed a firm non-boggy, nonnodular prostate. PCP has switched patient's antibiotic. Discussed with patient importance of follow up, will give urology follow up as well.   ____________________________________________   FINAL CLINICAL IMPRESSION(S) / ED DIAGNOSES  Final diagnoses:  Urinary retention  Lower urinary tract infectious disease     Note: This dictation was prepared with Dragon dictation. Any transcriptional errors that result from this process are unintentional     Phineas Semen, MD 09/21/17 [phone]

## 2017-09-21 NOTE — ED Notes (Signed)
Leg bag attached to foley catheter, extra leg provided for pt to take home.

## 2017-09-21 NOTE — Assessment & Plan Note (Signed)
Secondary to E Coli.  Now with urinary retention. Patient sent to ER for urgent evaluation . abx changed from Septra to Cipr and advised not to exercise for 3 weeks given remote history of biceps tendon rupture

## 2017-09-21 NOTE — Telephone Encounter (Signed)
Cipro has been sent to the pharmacy. Dr. Darrick Huntsmanullo has let the patients wife know.

## 2017-09-21 NOTE — ED Triage Notes (Signed)
Pt in via POV with complaints of urinary retention x 7 hours; pt reports currently being treated for UTI and prostatitis.  Pt reports discomfort to pelvis.

## 2017-09-21 NOTE — Telephone Encounter (Signed)
Copied from CRM [phone]#[id number]. Topic: Quick Communication - See Telephone Encounter >> Sep 21, 2017 12:04 PM Herby AbrahamJohnson, Shiquita C wrote:    CRM for notification. See Telephone encounter for: pt says that he was Dx with a UTI. Pt says that the medication was helping up until today. Pt says today he has been having discomfort when urinating. He would like to be advised further.   09/21/17.

## 2017-09-21 NOTE — Discharge Instructions (Signed)
Please seek medical attention for any high fevers, chest pain, shortness of breath, change in behavior, persistent vomiting, bloody stool or any other new or concerning symptoms.  

## 2017-09-24 ENCOUNTER — Encounter: Payer: Self-pay | Admitting: Internal Medicine

## 2017-09-27 ENCOUNTER — Encounter: Payer: Self-pay | Admitting: Internal Medicine

## 2017-10-01 ENCOUNTER — Encounter: Payer: Self-pay | Admitting: Urology

## 2017-10-01 ENCOUNTER — Ambulatory Visit: Payer: BC Managed Care – PPO | Admitting: Urology

## 2017-10-01 VITALS — BP 129/85 | HR 94 | Ht 70.0 in | Wt 216.0 lb

## 2017-10-01 DIAGNOSIS — N390 Urinary tract infection, site not specified: Secondary | ICD-10-CM | POA: Diagnosis not present

## 2017-10-01 DIAGNOSIS — N410 Acute prostatitis: Secondary | ICD-10-CM | POA: Diagnosis not present

## 2017-10-01 DIAGNOSIS — R339 Retention of urine, unspecified: Secondary | ICD-10-CM | POA: Diagnosis not present

## 2017-10-01 NOTE — Progress Notes (Signed)
Fill and Pull Catheter Removal  Patient is present today for a catheter removal.  Patient was cleaned and prepped in a sterile fashion 400ml of sterile water/ saline was instilled into the bladder when the patient felt the urge to urinate. 8ml of water was then drained from the balloon.  A 16FR foley cath was removed from the bladder no complications were noted .  Patient as then given some time to void on their own.  Patient can void  375ml on their own after some time.  Patient tolerated well.  Preformed by: Eligha BridegroomSarah Ingris Pasquarella, CMA

## 2017-10-01 NOTE — Progress Notes (Signed)
10/01/2017 8:39 AM   Izora Ribas Leppla June 25, 1966 [id number]  Referring provider: Sherlene Shams, MD [address] [place], [place] [address]  Chief Complaint  Patient presents with  . Follow-up    HPI: Timothy Hines is a 52 year old male who presents for follow-up of urinary retention.  He was seen in the ED on 09/21/2817 with acute urinary retention.  One week prior to onset of his retention he was complaining of low back pain, fever, malaise and flulike symptoms.  He was seen by his primary provider and had a urinalysis which showed pyuria.  He was started on Septra and urine culture subsequently grew E. coli and he was switched to Cipro for 14 days.  He denies previous history of UTI or urinary retention.  He is on testosterone replacement therapy.   PMH: Past Medical History:  Diagnosis Date  . Anxiety   . GERD (gastroesophageal reflux disease)   . Thyroid disease     Surgical History: Past Surgical History:  Procedure Laterality Date  . KNEE ARTHROSCOPY Left 2013  . NOSE SURGERY    . SHOULDER SURGERY Left april 2016  . THYROID SURGERY    . TONSILLECTOMY  1989    Home Medications:  Allergies as of 10/01/2017   No Known Allergies     Medication List        Accurate as of 10/01/17  8:39 AM. Always use your most recent med list.          ALPRAZolam 0.5 MG tablet Commonly known as:  XANAX Take 1 tablet (0.5 mg total) by mouth 2 (two) times daily as needed for anxiety.   cetirizine 10 MG tablet Commonly known as:  ZYRTEC Take 10 mg by mouth daily.   ciprofloxacin 500 MG tablet Commonly known as:  CIPRO Take 1 tablet (500 mg total) by mouth 2 (two) times daily.   fluticasone 50 MCG/ACT nasal spray Commonly known as:  FLONASE Place 2 sprays into both nostrils daily.   levothyroxine 125 MCG tablet Commonly known as:  SYNTHROID, LEVOTHROID Take 250 mcg by mouth daily before breakfast.   meloxicam 15 MG tablet Commonly known as:  MOBIC Take 1  tablet (15 mg total) by mouth daily.   sildenafil 20 MG tablet Commonly known as:  REVATIO Take 20 mg by mouth once a week.   sulfamethoxazole-trimethoprim 800-160 MG tablet Commonly known as:  BACTRIM DS,SEPTRA DS Take 1 tablet by mouth 2 (two) times daily.   testosterone cypionate 200 MG/ML injection Commonly known as:  DEPOTESTOSTERONE CYPIONATE Inject 0.88 mLs (175 mg total) into the muscle every 14 (fourteen) days.       Allergies: No Known Allergies  Family History: No family history on file.  Social History:  reports that  has never smoked. he has never used smokeless tobacco. He reports that he drinks about 4.2 oz of alcohol per week. He reports that he does not use drugs.  ROS: UROLOGY Frequent Urination?: No Hard to postpone urination?: No Burning/pain with urination?: No Get up at night to urinate?: No Leakage of urine?: No Urine stream starts and stops?: No Trouble starting stream?: No Do you have to strain to urinate?: No Blood in urine?: No Urinary tract infection?: No Sexually transmitted disease?: No Injury to kidneys or bladder?: No Painful intercourse?: No Weak stream?: No Erection problems?: No Penile pain?: No  Gastrointestinal Nausea?: No Vomiting?: No Indigestion/heartburn?: No Diarrhea?: No Constipation?: No  Constitutional Fever: No Night sweats?: No Weight loss?: No  Fatigue?: No  Skin Skin rash/lesions?: No Itching?: No  Eyes Blurred vision?: No Double vision?: No  Ears/Nose/Throat Sore throat?: No Sinus problems?: No  Hematologic/Lymphatic Swollen glands?: No Easy bruising?: No  Cardiovascular Leg swelling?: No Chest pain?: No  Respiratory Cough?: No Shortness of breath?: No  Endocrine Excessive thirst?: No  Musculoskeletal Back pain?: No Joint pain?: No  Neurological Headaches?: No Dizziness?: No  Psychologic Depression?: No Anxiety?: No  Physical Exam: BP 129/85   Pulse 94   Ht 5\' 10"  (1.778  m)   Wt 216 lb (98 kg)   BMI 30.99 kg/m   Constitutional:  Alert and oriented, No acute distress. HEENT: [place] AT, moist mucus membranes.  Trachea midline, no masses. Cardiovascular: No clubbing, cyanosis, or edema. Respiratory: Normal respiratory effort, no increased work of breathing. GI: Abdomen is soft, nontender, nondistended, no abdominal masses GU: No CVA tenderness. Penis circumcised without lesions, testes descended bilaterally without masses or tenderness, cord/epididymes palpably normal.  Foley catheter draining clear urine.  Prostate 45 g, smooth without nodules. Skin: No rashes, bruises or suspicious lesions. Lymph: No cervical or inguinal adenopathy. Neurologic: Grossly intact, no focal deficits, moving all 4 extremities. Psychiatric: Normal mood and affect.  Laboratory Data: Lab Results  Component Value Date   WBC 3.1 (L) 06/04/2017   HGB 17.2 (H) 06/04/2017   HCT 49.4 06/04/2017   MCV 93.7 06/04/2017   PLT 253.0 06/04/2017    Lab Results  Component Value Date   CREATININE 1.10 06/04/2017    Lab Results  Component Value Date   PSA1 1.0 07/13/2015    Lab Results  Component Value Date   TESTOSTERONE 770 06/04/2017     Assessment & Plan:   Clinical presentation consistent with acute prostatitis and subsequent urinary retention.  He will complete his 14-day course of Cipro and would recommend he start his Septra after completing the Cipro for a 28-day course of antibiotic therapy.  His Foley catheter was removed and a voiding trial was successful as per the nursing note.  Will schedule a follow-up renal ultrasound and visit in 1 month for symptom reassessment and bladder scan for PVR.  1. Acute prostatitis  - Ultrasound renal complete; Future  2.  Urinary retention   Return in about 4 weeks (around 10/29/2017) for Recheck, Bladder scan.   Riki AltesScott C Yamilette Garretson, MD  [facility] [address], [address] [place], [place]  [id number] [phone]

## 2017-10-02 ENCOUNTER — Encounter: Payer: Self-pay | Admitting: Urology

## 2017-10-24 ENCOUNTER — Ambulatory Visit
Admission: RE | Admit: 2017-10-24 | Discharge: 2017-10-24 | Disposition: A | Discharge status: Home / Self Care | Payer: BC Managed Care – PPO | Source: Ambulatory Visit | Attending: Urology | Admitting: Urology

## 2017-10-24 DIAGNOSIS — R339 Retention of urine, unspecified: Secondary | ICD-10-CM | POA: Insufficient documentation

## 2017-10-29 ENCOUNTER — Ambulatory Visit: Payer: BC Managed Care – PPO | Admitting: Urology

## 2017-10-29 ENCOUNTER — Encounter: Payer: Self-pay | Admitting: Urology

## 2017-10-29 DIAGNOSIS — R339 Retention of urine, unspecified: Secondary | ICD-10-CM

## 2017-10-29 LAB — BLADDER SCAN AMB NON-IMAGING: SCAN RESULT: 233

## 2017-10-29 MED ORDER — TAMSULOSIN HCL 0.4 MG PO CAPS: 0.4000 mg | ORAL_CAPSULE | Freq: Every day | ORAL | 1 refills | Status: DC

## 2017-10-29 NOTE — Progress Notes (Signed)
10/29/2017 9:10 AM   Timothy Hines [birth year] [id number]  Referring provider: Sherlene Shamsullo, Teresa L, MD [address] [place], [place] [address]  Chief Complaint  Patient presents with  . Urinary Retention  . Follow-up    HPI: 52 year old male presents for follow-up of urinary retention after an episode of acute prostatitis.  Refer to my prior note of 10/01/2017.  He states he is doing much better.  He is completed his antibiotic therapy.  He notes occasional slight decreased force and caliber of his urinary stream.  Denies dysuria or gross hematuria.  Denies flank, abdominal, pelvic or scrotal pain.   PMH: Past Medical History:  Diagnosis Date  . Anxiety   . GERD (gastroesophageal reflux disease)   . Thyroid disease     Surgical History: Past Surgical History:  Procedure Laterality Date  . KNEE ARTHROSCOPY Left 2013  . NOSE SURGERY    . SHOULDER SURGERY Left april 2016  . THYROID SURGERY    . TONSILLECTOMY  1989    Home Medications:  Allergies as of 10/29/2017   No Known Allergies     Medication List        Accurate as of 10/29/17  9:10 AM. Always use your most recent med list.          cetirizine 10 MG tablet Commonly known as:  ZYRTEC Take 10 mg by mouth daily.   fluticasone 50 MCG/ACT nasal spray Commonly known as:  FLONASE Place 2 sprays into both nostrils daily.   levothyroxine 125 MCG tablet Commonly known as:  SYNTHROID, LEVOTHROID Take 250 mcg by mouth daily before breakfast.   multivitamin capsule Take 1 capsule by mouth daily.   Saw Palmetto 1000 MG Caps Take by mouth.   sildenafil 20 MG tablet Commonly known as:  REVATIO Take 20 mg by mouth once a week.   testosterone cypionate 200 MG/ML injection Commonly known as:  DEPOTESTOSTERONE CYPIONATE Inject 0.88 mLs (175 mg total) into the muscle every 14 (fourteen) days.       Allergies: No Known Allergies  Family History: No family history on file.  Social  History:  reports that  has never smoked. he has never used smokeless tobacco. He reports that he drinks about 4.2 oz of alcohol per week. He reports that he does not use drugs.  ROS: UROLOGY Frequent Urination?: No Hard to postpone urination?: No Burning/pain with urination?: No Get up at night to urinate?: No Leakage of urine?: No Urine stream starts and stops?: No Trouble starting stream?: No Do you have to strain to urinate?: No Blood in urine?: No Urinary tract infection?: No Sexually transmitted disease?: No Injury to kidneys or bladder?: No Painful intercourse?: No Weak stream?: No Erection problems?: No Penile pain?: No  Gastrointestinal Nausea?: No Vomiting?: No Indigestion/heartburn?: No Diarrhea?: No Constipation?: No  Constitutional Fever: No Night sweats?: No Weight loss?: No Fatigue?: No  Skin Skin rash/lesions?: No Itching?: No  Eyes Blurred vision?: No Double vision?: No  Ears/Nose/Throat Sore throat?: No Sinus problems?: No  Hematologic/Lymphatic Swollen glands?: No Easy bruising?: No  Cardiovascular Leg swelling?: No Chest pain?: No  Respiratory Cough?: No Shortness of breath?: No  Endocrine Excessive thirst?: No  Musculoskeletal Back pain?: No Joint pain?: No  Neurological Headaches?: No Dizziness?: No  Psychologic Depression?: No Anxiety?: No  Physical Exam: There were no vitals taken for this visit.  Constitutional:  Alert and oriented, No acute distress. HEENT: [place] AT, moist mucus membranes.  Trachea midline, no  masses. Cardiovascular: No clubbing, cyanosis, or edema. Respiratory: Normal respiratory effort, no increased work of breathing. GI: Abdomen is soft, nontender, nondistended, no abdominal masses GU: No CVA tenderness Skin: No rashes, bruises or suspicious lesions. Psychiatric: Normal mood and affect.  Laboratory Data: Lab Results  Component Value Date   WBC 3.1 (L) 06/04/2017   HGB 17.2 (H) 06/04/2017    HCT 49.4 06/04/2017   MCV 93.7 06/04/2017   PLT 253.0 06/04/2017    Lab Results  Component Value Date   CREATININE 1.10 06/04/2017    Lab Results  Component Value Date   PSA 0.9 04/18/2016    Lab Results  Component Value Date   TESTOSTERONE 770 06/04/2017      Assessment & Plan:   Doing much better.  PVR today however was 233 mL.  Will place on tamsulosin and obtain a follow-up bladder scan in 1 month.   - BLADDER SCAN AMB NON-IMAGING    Riki Altes, MD  [facility] [address] [place], [place] [address] [phone]

## 2017-11-26 ENCOUNTER — Ambulatory Visit (INDEPENDENT_AMBULATORY_CARE_PROVIDER_SITE_OTHER): Payer: BC Managed Care – PPO

## 2017-11-26 VITALS — BP 130/89 | HR 85 | Resp 16 | Ht 70.0 in | Wt 218.8 lb

## 2017-11-26 DIAGNOSIS — R339 Retention of urine, unspecified: Secondary | ICD-10-CM | POA: Diagnosis not present

## 2017-11-26 LAB — BLADDER SCAN AMB NON-IMAGING

## 2017-12-04 ENCOUNTER — Encounter: Payer: Self-pay | Admitting: Internal Medicine

## 2017-12-05 ENCOUNTER — Ambulatory Visit: Payer: BC Managed Care – PPO | Admitting: Internal Medicine

## 2017-12-05 ENCOUNTER — Encounter: Payer: Self-pay | Admitting: Internal Medicine

## 2017-12-05 DIAGNOSIS — J209 Acute bronchitis, unspecified: Secondary | ICD-10-CM | POA: Diagnosis not present

## 2017-12-05 MED ORDER — ALBUTEROL SULFATE HFA 108 (90 BASE) MCG/ACT IN AERS
2.0000 | INHALATION_SPRAY | Freq: Four times a day (QID) | RESPIRATORY_TRACT | 0 refills | Status: DC | PRN
Start: 2017-12-05 — End: 2017-12-05

## 2017-12-05 MED ORDER — HYDROCOD POLST-CPM POLST ER 10-8 MG/5ML PO SUER: 5.0000 mL | Freq: Every evening | ORAL | 0 refills | Status: DC | PRN

## 2017-12-05 MED ORDER — AMOXICILLIN-POT CLAVULANATE 875-125 MG PO TABS: 1.0000 | ORAL_TABLET | Freq: Two times a day (BID) | ORAL | 0 refills | Status: DC

## 2017-12-05 MED ORDER — PREDNISONE 10 MG PO TABS: ORAL_TABLET | ORAL | 0 refills | Status: DC

## 2017-12-05 NOTE — Progress Notes (Signed)
Subjective:  Patient ID: Timothy Hines, male    DOB: March 25, 1966  Age: 52 y.o. MRN: [id number]  CC: The encounter diagnosis was Acute bronchitis, unspecified organism.  HPI Timothy Hines presents for EVAL AND TREATMENT of cough productive of purulent  sputum acc by rib pain from coughing, chest tightness,  Trouble taking a deep breath.  Started a week ago with  Pharyngitis without sinus pain or congestion,  Has allergic rhinitis managed with zyrtec,  But his Sinus drainage has been clear.  Wife and daughter both treated recently for same with abx,  and her daughter diagnosed  with H. Flu.   2) urinary retention secondary to BPH.  Post void residual dropped to 80 ccafter one month of flomax.  Still taking medicatio, fees a bit lethargic since he started taking the medication.  Still using testosteorne,  ok'd by Urology  orthostatics done today by me:   Systolic BP dropped from 128 to 112.     Outpatient Medications Prior to Visit  Medication Sig Dispense Refill  . cetirizine (ZYRTEC) 10 MG tablet Take 10 mg by mouth daily.    . fluticasone (FLONASE) 50 MCG/ACT nasal spray Place 2 sprays into both nostrils daily. 16 g 6  . levothyroxine (SYNTHROID, LEVOTHROID) 125 MCG tablet Take 250 mcg by mouth daily before breakfast.     . Multiple Vitamin (MULTIVITAMIN) capsule Take 1 capsule by mouth daily.    . Saw Palmetto 1000 MG CAPS Take by mouth.    . sildenafil (REVATIO) 20 MG tablet Take 20 mg by mouth once a week.    . tamsulosin (FLOMAX) 0.4 MG CAPS capsule Take 1 capsule (0.4 mg total) by mouth daily. 30 capsule 1  . testosterone cypionate (DEPOTESTOSTERONE CYPIONATE) 200 MG/ML injection Inject 0.88 mLs (175 mg total) into the muscle every 14 (fourteen) days. 10 mL 5   No facility-administered medications prior to visit.     Review of Systems;  Patient denies headache, fevers, , unintentional weight loss, skin rash, eye pain, sinus congestion and sinus pain, sore throat,  dysphagia,  hemoptysis , palpitations, orthopnea, edema, abdominal pain, nausea, melena, diarrhea, constipation, flank pain, dysuria, hematuria, urinary  Frequency, nocturia, numbness, tingling, seizures,  Focal weakness, Loss of consciousness,  Tremor, insomnia, depression, anxiety, and suicidal ideation.      Objective:  BP 126/82 (BP Location: Left Arm, Patient Position: Sitting, Cuff Size: Large)   Pulse 84   Temp 98.7 F (37.1 C) (Oral)   Resp 15   Ht 5\' 10"  (1.778 m)   Wt 219 lb 6.4 oz (99.5 kg)   SpO2 95%   BMI 31.48 kg/m   BP Readings from Last 3 Encounters:  12/05/17 126/82  11/26/17 130/89  10/01/17 129/85    Wt Readings from Last 3 Encounters:  12/05/17 219 lb 6.4 oz (99.5 kg)  11/26/17 218 lb 12.8 oz (99.2 kg)  10/01/17 216 lb (98 kg)    General appearance: alert, cooperative and appears stated age Ears: normal TM's and external ear canals both ears Throat: lips, mucosa, and tongue normal; teeth and gums normal Neck: no adenopathy, no carotid bruit, supple, symmetrical, trachea midline and thyroid not enlarged, symmetric, no tenderness/mass/nodules Back: symmetric, no curvature. ROM normal. No CVA tenderness. Lungs: clear to auscultation bilaterally Heart: regular rate and rhythm, S1, S2 normal, no murmur, click, rub or gallop Abdomen: soft, non-tender; bowel sounds normal; no masses,  no organomegaly Pulses: 2+ and symmetric Skin: Skin color, texture, turgor normal. No rashes  or lesions Lymph nodes: Cervical, supraclavicular, and axillary nodes normal.  No results found for: HGBA1C  Lab Results  Component Value Date   CREATININE 1.10 06/04/2017   CREATININE 1.08 08/08/2016   CREATININE 1.02 04/18/2016    Lab Results  Component Value Date   WBC 3.1 (L) 06/04/2017   HGB 17.2 (H) 06/04/2017   HCT 49.4 06/04/2017   PLT 253.0 06/04/2017   GLUCOSE 101 (H) 06/04/2017   CHOL 191 06/04/2017   TRIG 72.0 06/04/2017   HDL 43.80 06/04/2017   LDLCALC 133 (H)  06/04/2017   ALT 23 06/04/2017   AST 28 06/04/2017   NA 139 06/04/2017   K 3.8 06/04/2017   CL 103 06/04/2017   CREATININE 1.10 06/04/2017   BUN 16 06/04/2017   CO2 27 06/04/2017   PSA 0.9 04/18/2016    Ultrasound Renal Complete  Result Date: 10/25/2017 CLINICAL DATA:  Urinary retention EXAM: RENAL / URINARY TRACT ULTRASOUND COMPLETE COMPARISON:  None. FINDINGS: Right Kidney: Length: 11.4 cm. Echogenicity within normal limits. No mass or hydronephrosis visualized. Left Kidney: Length: 11.4 cm. Echogenicity within normal limits. No mass or hydronephrosis visualized. Bladder: Appears normal for degree of bladder distention. IMPRESSION: Unremarkable renal ultrasound. Electronically Signed   By: Alcide CleverMark  Lukens M.D.   On: 10/25/2017 08:20    Assessment & Plan:   Problem List Items Addressed This Visit    Bronchitis, acute    Empiric antibiotics,  cough suppressant, bronchodilators , and prednisone taper.          I have discontinued Maisie Fushomas C. Trick's albuterol. I am also having him start on predniSONE, amoxicillin-clavulanate, and chlorpheniramine-HYDROcodone. Additionally, I am having him maintain his levothyroxine, cetirizine, fluticasone, sildenafil, testosterone cypionate, multivitamin, Saw Palmetto, and tamsulosin.  Meds ordered this encounter  Medications  . DISCONTD: albuterol (PROVENTIL HFA;VENTOLIN HFA) 108 (90 Base) MCG/ACT inhaler    Sig: Inhale 2 puffs into the lungs every 6 (six) hours as needed for wheezing or shortness of breath.    Dispense:  1 Inhaler    Refill:  0  . predniSONE (DELTASONE) 10 MG tablet    Sig: 6 tablets on Day 1 , then reduce by 1 tablet daily until gone    Dispense:  21 tablet    Refill:  0  . amoxicillin-clavulanate (AUGMENTIN) 875-125 MG tablet    Sig: Take 1 tablet by mouth 2 (two) times daily.    Dispense:  14 tablet    Refill:  0  . chlorpheniramine-HYDROcodone (TUSSIONEX PENNKINETIC ER) 10-8 MG/5ML SUER    Sig: Take 5 mLs by mouth at  bedtime as needed for cough.    Dispense:  140 mL    Refill:  0    Medications Discontinued During This Encounter  Medication Reason  . albuterol (PROVENTIL HFA;VENTOLIN HFA) 108 (90 Base) MCG/ACT inhaler     Follow-up: No follow-ups on file.   Sherlene Shamseresa L Maurica Omura, MD

## 2017-12-05 NOTE — Telephone Encounter (Signed)
Scheduled

## 2017-12-05 NOTE — Patient Instructions (Signed)
You have acute bronchitis.  augmentin ,  Prednisone ,  Albuterol inhaler and tussionex   chest x ray if not significantly better in one week (cough continue but sputum should not be green)   Continue zyrtec and mucinex     You should try [employer]'s Sinus rinse ;  It is a stong sinus "flush" using water and medicated salts.  Do it over the sink because it can be a bit messy  You are contagious as long as you are feeling sick.

## 2017-12-06 DIAGNOSIS — J209 Acute bronchitis, unspecified: Secondary | ICD-10-CM | POA: Insufficient documentation

## 2017-12-06 NOTE — Assessment & Plan Note (Addendum)
Empiric antibiotics,  cough suppressant, bronchodilators , and prednisone taper.

## 2017-12-12 ENCOUNTER — Encounter: Payer: Self-pay | Admitting: Internal Medicine

## 2017-12-16 ENCOUNTER — Ambulatory Visit
Admission: EM | Admit: 2017-12-16 | Discharge: 2017-12-16 | Disposition: A | Discharge status: Home / Self Care | Payer: BC Managed Care – PPO | Attending: Family Medicine | Admitting: Family Medicine

## 2017-12-16 ENCOUNTER — Other Ambulatory Visit: Payer: Self-pay

## 2017-12-16 ENCOUNTER — Ambulatory Visit (INDEPENDENT_AMBULATORY_CARE_PROVIDER_SITE_OTHER): Payer: BC Managed Care – PPO

## 2017-12-16 DIAGNOSIS — J209 Acute bronchitis, unspecified: Secondary | ICD-10-CM

## 2017-12-16 NOTE — ED Provider Notes (Signed)
[facility]    CSN: [id number] Arrival date & time: 12/16/17  1052  History   Chief Complaint Chief Complaint  Patient presents with  . Cough  . Appointment   HPI  52 year old male presents with cough and congestion.  Patient states that he has had symptoms for 2.5 weeks.  He saw his PCP on 4/17.  He was diagnosed with acute bronchitis.  He was treated with Augmentin, albuterol, prednisone, and Tussionex.  Patient states that he continues to have productive cough.  Chest congestion/tightness.  No fever.  He states that he generally feels poor.  No known exacerbating factors.  No other associated symptoms.  No other complaints.  Past Medical History:  Diagnosis Date  . Anxiety   . GERD (gastroesophageal reflux disease)   . Thyroid disease    Patient Active Problem List   Diagnosis Date Noted  . Bronchitis, acute 12/06/2017  . Polycythemia, secondary 09/04/2016  . Hyperlipidemia 04/18/2016  . Environmental and seasonal allergies 01/18/2016  . Plantar fasciitis of right foot 01/18/2016  . 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 Hypogonadism in male 07/12/2015  . Acute anxiety 07/12/2015  . Erectile dysfunction 07/12/2015   Past Surgical History:  Procedure Laterality Date  . KNEE ARTHROSCOPY Left 2013  . NOSE SURGERY    . SHOULDER SURGERY Left april 2016  . THYROID SURGERY    . TONSILLECTOMY  1989   Home Medications    Prior to Admission medications   Medication Sig Start Date End Date Taking? Authorizing Provider  cetirizine (ZYRTEC) 10 MG tablet Take 10 mg by mouth daily.    [provider]  chlorpheniramine-HYDROcodone (TUSSIONEX PENNKINETIC ER) 10-8 MG/5ML SUER Take 5 mLs by mouth at bedtime as needed for cough. 12/05/17   Sherlene Shams, MD  fluticasone (FLONASE) 50 MCG/ACT nasal spray Place 2 sprays into both nostrils daily. 01/18/16   Ellyn Hack, MD  levothyroxine (SYNTHROID, LEVOTHROID) 125 MCG tablet Take 250 mcg by mouth daily  before breakfast.  07/01/15   [provider]  Multiple Vitamin (MULTIVITAMIN) capsule Take 1 capsule by mouth daily.    [provider]  Saw Palmetto 1000 MG CAPS Take by mouth.    [provider]  sildenafil (REVATIO) 20 MG tablet Take 20 mg by mouth once a week.    [provider]  tamsulosin (FLOMAX) 0.4 MG CAPS capsule Take 1 capsule (0.4 mg total) by mouth daily. 10/29/17   Stoioff, Verna Czech, MD  testosterone cypionate (DEPOTESTOSTERONE CYPIONATE) 200 MG/ML injection Inject 0.88 mLs (175 mg total) into the muscle every 14 (fourteen) days. 06/05/17   Sherlene Shams, MD   Family History History reviewed. No pertinent family history.  Social History Social History   Tobacco Use  . Smoking status: Never Smoker  . Smokeless tobacco: Never Used  Substance Use Topics  . Alcohol use: Yes    Alcohol/week: 4.2 oz    Types: 7 Shots of liquor per week  . Drug use: No   Allergies   Patient has no known allergies.   Review of Systems Review of Systems  Constitutional: Positive for fatigue. Negative for fever.  Respiratory: Positive for cough and chest tightness.    Physical Exam Triage Vital Signs ED Triage Vitals  Enc Vitals Group     BP 12/16/17 1102 (!) 144/107     Pulse Rate 12/16/17 1102 90     Resp 12/16/17 1102 18     Temp 12/16/17 1102 98.6 F (37  C)     Temp Source 12/16/17 1102 Oral     SpO2 12/16/17 1102 99 %     Weight 12/16/17 1103 218 lb (98.9 kg)     Height 12/16/17 1103  (1.778 m)     Head Circumference --      Peak Flow --      Pain Score 12/16/17 1103 0     Pain Loc --      Pain Edu? --      Excl. in GC? --    Updated Vital Signs BP (!) 144/107 (BP Location: Left Arm)   Pulse 90   Temp 98.6 F (37 C) (Oral)   Resp 18   Ht  (1.778 m)   Wt 218 lb (98.9 kg)   SpO2 99%   BMI 31.28 kg/m   Physical Exam  Constitutional: He is oriented to person, place, and time. He appears well-developed. No distress.    HENT:  Head: Normocephalic and atraumatic.  Mouth/Throat: Oropharynx is clear and moist.  Cardiovascular: Normal rate and regular rhythm.  Pulmonary/Chest: Effort normal and breath sounds normal. He has no wheezes. He has no rales.  Neurological: He is alert and oriented to person, place, and time.  Psychiatric: He has a normal mood and affect. His behavior is normal.  Nursing note and vitals reviewed.  UC Treatments / Results  Labs (all labs ordered are listed, but only abnormal results are displayed) Labs Reviewed - No data to display  EKG None Radiology Dg Chest 2 View  Result Date: 12/16/2017 CLINICAL DATA:  Productive cough for 3 weeks. EXAM: CHEST - 2 VIEW COMPARISON:  06/08/2014 FINDINGS: The heart size and mediastinal contours are within normal limits. Both lungs are clear. Stable mild thoracic and lumbar spine degenerative disc disease. IMPRESSION: Stable exam.  No active cardiopulmonary disease. Electronically Signed   By: Myles Rosenthal M.D.   On: 12/16/2017 12:08    Procedures Procedures (including critical care time)  Medications Ordered in UC Medications - No data to display   Initial Impression / Assessment and Plan / UC Course  I have reviewed the triage vital signs and the nursing notes.  Pertinent labs & imaging results that were available during my care of the patient were reviewed by me and considered in my medical decision making (see chart for details).    52 year old male presents with acute bronchitis.  Chest x-ray negative.  Advised continued symptomatic treatment at home.  Supportive care.  Final Clinical Impressions(s) / UC Diagnoses   Final diagnoses:  Acute bronchitis, unspecified organism   ED Discharge Orders    None     Controlled Substance Prescriptions [place] Controlled Substance Registry consulted? Not Applicable   Tommie Sams, DO 12/16/17 1235

## 2017-12-16 NOTE — ED Triage Notes (Signed)
Pt coughing up green phlegm, ears feel stopped up and chest congestion. Pt finished abx and finished steroids prescribed by PCP.

## 2017-12-16 NOTE — Discharge Instructions (Signed)
Give it some more time.  Discuss with Dr. [name] if you are not improving.  Take care  Dr. Adriana Simas

## 2017-12-28 ENCOUNTER — Ambulatory Visit: Payer: BC Managed Care – PPO | Admitting: Urology

## 2018-04-12 ENCOUNTER — Other Ambulatory Visit: Payer: Self-pay | Admitting: Internal Medicine

## 2018-04-12 DIAGNOSIS — E291 Testicular hypofunction: Secondary | ICD-10-CM

## 2018-04-15 NOTE — Telephone Encounter (Signed)
Refilled: 06/05/2017 Last OV: 12/05/2017 Next OV: not scheduled

## 2018-05-17 ENCOUNTER — Encounter: Payer: Self-pay | Admitting: Internal Medicine

## 2018-05-17 ENCOUNTER — Ambulatory Visit: Payer: BC Managed Care – PPO | Admitting: Internal Medicine

## 2018-05-17 VITALS — BP 130/86 | HR 74 | Temp 98.5°F | Resp 16 | Ht 70.0 in | Wt 224.0 lb

## 2018-05-17 DIAGNOSIS — Z79899 Other long term (current) drug therapy: Secondary | ICD-10-CM | POA: Diagnosis not present

## 2018-05-17 DIAGNOSIS — E7800 Pure hypercholesterolemia, unspecified: Secondary | ICD-10-CM

## 2018-05-17 DIAGNOSIS — N138 Other obstructive and reflux uropathy: Secondary | ICD-10-CM

## 2018-05-17 DIAGNOSIS — D751 Secondary polycythemia: Secondary | ICD-10-CM

## 2018-05-17 DIAGNOSIS — J209 Acute bronchitis, unspecified: Secondary | ICD-10-CM

## 2018-05-17 DIAGNOSIS — Z125 Encounter for screening for malignant neoplasm of prostate: Secondary | ICD-10-CM

## 2018-05-17 DIAGNOSIS — F5105 Insomnia due to other mental disorder: Secondary | ICD-10-CM

## 2018-05-17 DIAGNOSIS — E291 Testicular hypofunction: Secondary | ICD-10-CM | POA: Diagnosis not present

## 2018-05-17 DIAGNOSIS — F409 Phobic anxiety disorder, unspecified: Secondary | ICD-10-CM

## 2018-05-17 DIAGNOSIS — N401 Enlarged prostate with lower urinary tract symptoms: Secondary | ICD-10-CM

## 2018-05-17 LAB — CBC WITH DIFFERENTIAL/PLATELET
BASOS PCT: 1.6 % (ref 0.0–3.0)
Basophils Absolute: 0.1 10*3/uL (ref 0.0–0.1)
EOS PCT: 2 % (ref 0.0–5.0)
Eosinophils Absolute: 0.1 10*3/uL (ref 0.0–0.7)
HEMATOCRIT: 49.3 % (ref 39.0–52.0)
HEMOGLOBIN: 17 g/dL (ref 13.0–17.0)
LYMPHS PCT: 28.1 % (ref 12.0–46.0)
Lymphs Abs: 1.1 10*3/uL (ref 0.7–4.0)
MCHC: 34.4 g/dL (ref 30.0–36.0)
MCV: 90.2 fl (ref 78.0–100.0)
MONOS PCT: 8.5 % (ref 3.0–12.0)
Monocytes Absolute: 0.3 10*3/uL (ref 0.1–1.0)
Neutro Abs: 2.2 10*3/uL (ref 1.4–7.7)
Neutrophils Relative %: 59.8 % (ref 43.0–77.0)
Platelets: 254 10*3/uL (ref 150.0–400.0)
RBC: 5.47 Mil/uL (ref 4.22–5.81)
RDW: 13 % (ref 11.5–15.5)
WBC: 3.8 10*3/uL — AB (ref 4.0–10.5)

## 2018-05-17 LAB — COMPREHENSIVE METABOLIC PANEL
ALBUMIN: 4.8 g/dL (ref 3.5–5.2)
ALT: 35 U/L (ref 0–53)
AST: 27 U/L (ref 0–37)
Alkaline Phosphatase: 45 U/L (ref 39–117)
BILIRUBIN TOTAL: 1.1 mg/dL (ref 0.2–1.2)
BUN: 19 mg/dL (ref 6–23)
CO2: 28 mEq/L (ref 19–32)
Calcium: 9.6 mg/dL (ref 8.4–10.5)
Chloride: 103 mEq/L (ref 96–112)
Creatinine, Ser: 1.09 mg/dL (ref 0.40–1.50)
GFR: 75.46 mL/min (ref >60.00)
GLUCOSE: 100 mg/dL — AB (ref 70–99)
Potassium: 4.3 mEq/L (ref 3.5–5.1)
SODIUM: 140 meq/L (ref 135–145)
TOTAL PROTEIN: 7.4 g/dL (ref 6.0–8.3)

## 2018-05-17 LAB — PSA: PSA: 3.83 ng/mL (ref 0.10–4.00)

## 2018-05-17 LAB — LIPID PANEL
CHOLESTEROL: 228 mg/dL — AB (ref 0–200)
HDL: 45.6 mg/dL (ref >39.00)
LDL Cholesterol: 167 mg/dL — ABNORMAL HIGH (ref 0–99)
NONHDL: 182.16
Total CHOL/HDL Ratio: 5
Triglycerides: 74 mg/dL (ref 0.0–149.0)
VLDL: 14.8 mg/dL (ref 0.0–40.0)

## 2018-05-17 MED ORDER — HYDROCOD POLST-CPM POLST ER 10-8 MG/5ML PO SUER: 5.0000 mL | Freq: Every evening | ORAL | 0 refills | Status: DC | PRN

## 2018-05-17 MED ORDER — AMOXICILLIN-POT CLAVULANATE 875-125 MG PO TABS: 1.0000 | ORAL_TABLET | Freq: Two times a day (BID) | ORAL | 0 refills | Status: DC

## 2018-05-17 MED ORDER — ALPRAZOLAM 0.25 MG PO TABS: 0.2500 mg | ORAL_TABLET | Freq: Every evening | ORAL | 2 refills | Status: DC | PRN

## 2018-05-17 MED ORDER — PREDNISONE 10 MG PO TABS: ORAL_TABLET | ORAL | 0 refills | Status: DC

## 2018-05-17 NOTE — Patient Instructions (Addendum)
I am treating you for sinusitis/otitis which is a complication from your viral infection due to  persistent sinus congestion.   I am prescribing an antibiotic (augmentin )  and a prednisone taper  To manage the infection and the inflammation in your ear/sinuses.   I also advise use of the following OTC meds to help with your other symptoms.   Take generic OTC benadryl 25 mg at bedtime (IF YOU DON'T TAKE THE TUSSIONEX) ,  Sudafed PE  10 to 30 mg every 8 hours for the congestion, you may substitute Afrin nasal spray for the nighttime dose of sudafed PE  If needed to prevent insomnia.  flush your sinuses twice daily with Simply Saline (do over the sink because if you do it right you will spit out globs of mucus)  Use TUSSIONEX FOR NIGHTTIME COUGH,  DELSYM FOR DAYTIME .  Gargle with salt water as needed for sore throat.   Remember that tussionex has vicodin in it so DO NOT SHARE WITH OTHERS   Daily use of Probiotics for  Is  3 weeks advised to reduce risk of C dificile colitis.

## 2018-05-17 NOTE — Progress Notes (Signed)
Subjective:  Patient ID: Timothy Hines, male    DOB: 1966-04-27  Age: 52 y.o. MRN: [id number]  CC: The primary encounter diagnosis was Hypogonadism in male. Diagnoses of Polycythemia, secondary, Pure hypercholesterolemia, Long-term use of high-risk medication, Acute bronchitis, unspecified organism, Insomnia due to anxiety and fear, Prostate cancer screening, and BPH with obstruction/lower urinary tract symptoms were also pertinent to this visit.  HPI Timothy Hines presents for evaluation and treatment of cough , and follow up on multiple other issues including  testosterone deficiency , prostate cancer screening , and insomnia managed with prn alprazolam.   Cough:   Symptoms have been present for one week . Using zyrtec,  flonase and simus rinse,    No change.  Ears feel full,  Chest congested ,  Purulent nasal drainage. No body aches or fevers   .Polycythemia:  hgb has been mildly elevated at 17.3 .  Has seen heme onc  Regular blood donation rec'd.  Discussed the interval of donating  every 3 months  Testosterone deficiency:  Using parental  supplementation   .  Last level was high in January, dose was reduced  To 200 mcg . Last dose was one week ago .  Alprazolam use:  Limits use to prn for insomnia  usuallly takes /2 tablet  Nightly a few days in a row,  Then stops   Outpatient Medications Prior to Visit  Medication Sig Dispense Refill  . cetirizine (ZYRTEC) 10 MG tablet Take 10 mg by mouth daily.    . fluticasone (FLONASE) 50 MCG/ACT nasal spray Place 2 sprays into both nostrils daily. 16 g 6  . levothyroxine (SYNTHROID, LEVOTHROID) 125 MCG tablet Take 250 mcg by mouth daily before breakfast.     . meloxicam (MOBIC) 15 MG tablet     . Multiple Vitamin (MULTIVITAMIN) capsule Take 1 capsule by mouth daily.    . sildenafil (REVATIO) 20 MG tablet Take 20 mg by mouth once a week.    . testosterone cypionate (DEPOTESTOSTERONE CYPIONATE) 200 MG/ML injection INJECT 1 ML EVERY  14 DAYS. 10 mL 0  . ALPRAZolam (XANAX) 0.25 MG tablet     . chlorpheniramine-HYDROcodone (TUSSIONEX PENNKINETIC ER) 10-8 MG/5ML SUER Take 5 mLs by mouth at bedtime as needed for cough. (Patient not taking: Reported on 05/17/2018) 140 mL 0  . Saw Palmetto 1000 MG CAPS Take by mouth.    . tamsulosin (FLOMAX) 0.4 MG CAPS capsule Take 1 capsule (0.4 mg total) by mouth daily. 30 capsule 1   No facility-administered medications prior to visit.     Review of Systems;  Patient denies headache, fevers, malaise, unintentional weight loss, skin rash, eye pain, sinus congestion and sinus pain, sore throat, dysphagia,  hemoptysis , cough, dyspnea, wheezing, chest pain, palpitations, orthopnea, edema, abdominal pain, nausea, melena, diarrhea, constipation, flank pain, dysuria, hematuria, urinary  Frequency, nocturia, numbness, tingling, seizures,  Focal weakness, Loss of consciousness,  Tremor, insomnia, depression, anxiety, and suicidal ideation.      Objective:  BP 130/86 (BP Location: Left Arm, Patient Position: Sitting, Cuff Size: Large)   Pulse 74   Temp 98.5 F (36.9 C) (Oral)   Resp 16   Ht 5\' 10"  (1.778 m)   Wt 224 lb (101.6 kg)   SpO2 97%   BMI 32.14 kg/m   BP Readings from Last 3 Encounters:  05/17/18 130/86  12/16/17 (!) 144/107  12/05/17 126/82    Wt Readings from Last 3 Encounters:  05/17/18 224 lb (101.6 kg)  12/16/17 218 lb (98.9 kg)  12/05/17 219 lb 6.4 oz (99.5 kg)    General appearance: alert, cooperative and appears stated age Ears: bilateral injected TMs,   Serous effusion behind the right TM  Throat: lips, mucosa, and tongue normal; teeth and gums normal. Tonsillar erythema without ulceration  Neck: no adenopathy, no carotid bruit, supple, symmetrical, trachea midline and thyroid not enlarged, symmetric, no tenderness/mass/nodules Back: symmetric, no curvature. ROM normal. No CVA tenderness. Lungs: clear to auscultation bilaterally Heart: regular rate and rhythm,  S1, S2 normal, no murmur, click, rub or gallop Abdomen: soft, non-tender; bowel sounds normal; no masses,  no organomegaly Pulses: 2+ and symmetric Skin: Skin color, texture, turgor normal. No rashes or lesions Lymph nodes: Cervical, supraclavicular, and axillary nodes normal.  No results found for: HGBA1C  Lab Results  Component Value Date   CREATININE 1.09 05/17/2018   CREATININE 1.10 06/04/2017   CREATININE 1.08 08/08/2016    Lab Results  Component Value Date   WBC 3.8 (L) 05/17/2018   HGB 17.0 05/17/2018   HCT 49.3 05/17/2018   PLT 254.0 05/17/2018   GLUCOSE 100 (H) 05/17/2018   CHOL 228 (H) 05/17/2018   TRIG 74.0 05/17/2018   HDL 45.60 05/17/2018   LDLCALC 167 (H) 05/17/2018   ALT 35 05/17/2018   AST 27 05/17/2018   NA 140 05/17/2018   K 4.3 05/17/2018   CL 103 05/17/2018   CREATININE 1.09 05/17/2018   BUN 19 05/17/2018   CO2 28 05/17/2018   PSA 3.83 05/17/2018    Dg Chest 2 View  Result Date: 12/16/2017 CLINICAL DATA:  Productive cough for 3 weeks. EXAM: CHEST - 2 VIEW COMPARISON:  06/08/2014 FINDINGS: The heart size and mediastinal contours are within normal limits. Both lungs are clear. Stable mild thoracic and lumbar spine degenerative disc disease. IMPRESSION: Stable exam.  No active cardiopulmonary disease. Electronically Signed   By: Myles Rosenthal M.D.   On: 12/16/2017 12:08    Assessment & Plan:   Problem List Items Addressed This Visit    BPH with obstruction/lower urinary tract symptoms    History of acute prostatitis with acute urinary retention requiring catheterization and urology follow up in Feb/March 2019. Post treatment he continued to have weak stream.  Tamsulosin was started and PVR dropped from 237 to 86 ml after one month       Bronchitis, acute    With otitis on exam.  Empiric antibiotics,  Prednisone taper, and Cough suppressant       Hyperlipidemia    10 yr risk using FRC is 12% .  30 pt jump in LDL since last year noted  .  Will  address with diet and repeat in 6 months   Lab Results  Component Value Date   CHOL 228 (H) 05/17/2018   HDL 45.60 05/17/2018   LDLCALC 167 (H) 05/17/2018   TRIG 74.0 05/17/2018   CHOLHDL 5 05/17/2018        Relevant Orders   Lipid panel (Completed)   Hypogonadism in male - Primary    Dose reduced by 50% in December.  He is  midway between biweekly injections and will have level checked today. .       Relevant Orders   Testos,Total,Free and SHBG (Male)   Insomnia due to anxiety and fear    Managed with prn use of alprazolam. The risks and benefits of benzodiazepine use were reviewed with patient today including excessive sedation leading to respiratory depression,  impaired thinking/driving, and  addiction.  Patient was advised to avoid concurrent use with alcohol, to use medication only as needed and not to share with others  .        Polycythemia, secondary    Mild, with evaluation by Heme Onc to rule out PCV.  Heme Onc recommended blood donation for hgb > 18  Lab Results  Component Value Date   WBC 3.8 (L) 05/17/2018   HGB 17.0 05/17/2018   HCT 49.3 05/17/2018   MCV 90.2 05/17/2018   PLT 254.0 05/17/2018         Relevant Orders   CBC with Differential/Platelet (Completed)   Prostate cancer screening    PSA is normal,  But markedly higher than in 2017.  He is using testosterone and tamsulosin. .  Will refer to urology for follow up        Other Visit Diagnoses    Long-term use of high-risk medication       Relevant Orders   Comprehensive metabolic panel (Completed)   PSA (Completed)      I have discontinued Maisie Fus C. Lofquist "Tommy"'s Saw Palmetto and tamsulosin. I have also changed his ALPRAZolam. Additionally, I am having him start on predniSONE and amoxicillin-clavulanate. Lastly, I am having him maintain his levothyroxine, cetirizine, fluticasone, sildenafil, multivitamin, testosterone cypionate, meloxicam, and chlorpheniramine-HYDROcodone.  Meds ordered  this encounter  Medications  . chlorpheniramine-HYDROcodone (TUSSIONEX PENNKINETIC ER) 10-8 MG/5ML SUER    Sig: Take 5 mLs by mouth at bedtime as needed for cough.    Dispense:  140 mL    Refill:  0  . predniSONE (DELTASONE) 10 MG tablet    Sig: 6 tablets on Day 1 , then reduce by 1 tablet daily until gone    Dispense:  21 tablet    Refill:  0  . amoxicillin-clavulanate (AUGMENTIN) 875-125 MG tablet    Sig: Take 1 tablet by mouth 2 (two) times daily.    Dispense:  14 tablet    Refill:  0  . ALPRAZolam (XANAX) 0.25 MG tablet    Sig: Take 1 tablet (0.25 mg total) by mouth at bedtime as needed for anxiety.    Dispense:  30 tablet    Refill:  2   A total of 40 minutes was spent with patient more than half of which was spent in counseling patient on the above mentioned issues , reviewing and explaining recent labs and imaging studies done, and coordination of care. Medications Discontinued During This Encounter  Medication Reason  . Saw Palmetto 1000 MG CAPS Patient Preference  . tamsulosin (FLOMAX) 0.4 MG CAPS capsule Patient has not taken in last 30 days  . chlorpheniramine-HYDROcodone (TUSSIONEX PENNKINETIC ER) 10-8 MG/5ML SUER Patient has not taken in last 30 days  . ALPRAZolam (XANAX) 0.25 MG tablet Reorder    Follow-up: No follow-ups on file.   Sherlene Shams, MD

## 2018-05-19 DIAGNOSIS — Z125 Encounter for screening for malignant neoplasm of prostate: Secondary | ICD-10-CM | POA: Insufficient documentation

## 2018-05-19 DIAGNOSIS — N401 Enlarged prostate with lower urinary tract symptoms: Secondary | ICD-10-CM

## 2018-05-19 DIAGNOSIS — F5105 Insomnia due to other mental disorder: Secondary | ICD-10-CM

## 2018-05-19 DIAGNOSIS — N138 Other obstructive and reflux uropathy: Secondary | ICD-10-CM | POA: Insufficient documentation

## 2018-05-19 DIAGNOSIS — F409 Phobic anxiety disorder, unspecified: Secondary | ICD-10-CM | POA: Insufficient documentation

## 2018-05-19 DIAGNOSIS — Z0000 Encounter for general adult medical examination without abnormal findings: Secondary | ICD-10-CM | POA: Insufficient documentation

## 2018-05-19 NOTE — Assessment & Plan Note (Signed)
History of acute prostatitis with acute urinary retention requiring catheterization and urology follow up in Feb/March 2019. Post treatment he continued to have weak stream.  Tamsulosin was started and PVR dropped from 237 to 86 ml after one month

## 2018-05-19 NOTE — Assessment & Plan Note (Signed)
Managed with prn use of alprazolam. The risks and benefits of benzodiazepine use were reviewed with patient today including excessive sedation leading to respiratory depression,  impaired thinking/driving, and addiction.  Patient was advised to avoid concurrent use with alcohol, to use medication only as needed and not to share with others  .   

## 2018-05-19 NOTE — Assessment & Plan Note (Signed)
Mild, with evaluation by Heme Onc to rule out PCV.  Heme Onc recommended blood donation for hgb > 18  Lab Results  Component Value Date   WBC 3.8 (L) 05/17/2018   HGB 17.0 05/17/2018   HCT 49.3 05/17/2018   MCV 90.2 05/17/2018   PLT 254.0 05/17/2018

## 2018-05-19 NOTE — Assessment & Plan Note (Signed)
With otitis on exam.  Empiric antibiotics,  Prednisone taper, and Cough suppressant

## 2018-05-19 NOTE — Assessment & Plan Note (Signed)
Dose reduced by 50% in December.  He is  midway between biweekly injections and will have level checked today. Marland Kitchen

## 2018-05-19 NOTE — Assessment & Plan Note (Addendum)
PSA is normal,  But markedly higher than in 2017.  He is using testosterone and tamsulosin. .  Will refer to urology for follow up

## 2018-05-19 NOTE — Assessment & Plan Note (Signed)
10 yr risk using FRC is 12% .  30 pt jump in LDL since last year noted  .  Will address with diet and repeat in 6 months   Lab Results  Component Value Date   CHOL 228 (H) 05/17/2018   HDL 45.60 05/17/2018   LDLCALC 167 (H) 05/17/2018   TRIG 74.0 05/17/2018   CHOLHDL 5 05/17/2018

## 2018-05-21 LAB — TESTOS,TOTAL,FREE AND SHBG (FEMALE)
FREE TESTOSTERONE: 172.9 pg/mL — AB (ref 35.0–155.0)
Sex Hormone Binding: 10 nmol/L (ref 10–50)
Testosterone, Total, LC-MS-MS: 686 ng/dL (ref 250–1100)

## 2018-05-22 ENCOUNTER — Other Ambulatory Visit: Payer: Self-pay | Admitting: Internal Medicine

## 2018-05-22 DIAGNOSIS — E291 Testicular hypofunction: Secondary | ICD-10-CM

## 2018-05-22 MED ORDER — TESTOSTERONE CYPIONATE 200 MG/ML IM SOLN: 50.0000 mg | INTRAMUSCULAR | 0 refills | Status: DC

## 2018-07-19 IMAGING — CR DG CHEST 2V
2 series · 2 of 2 positions shown · non-contrast
Comparison: 06/08/2014

CLINICAL DATA: Productive cough for 3 weeks.

EXAM:
CHEST - 2 VIEW

[chest pa]
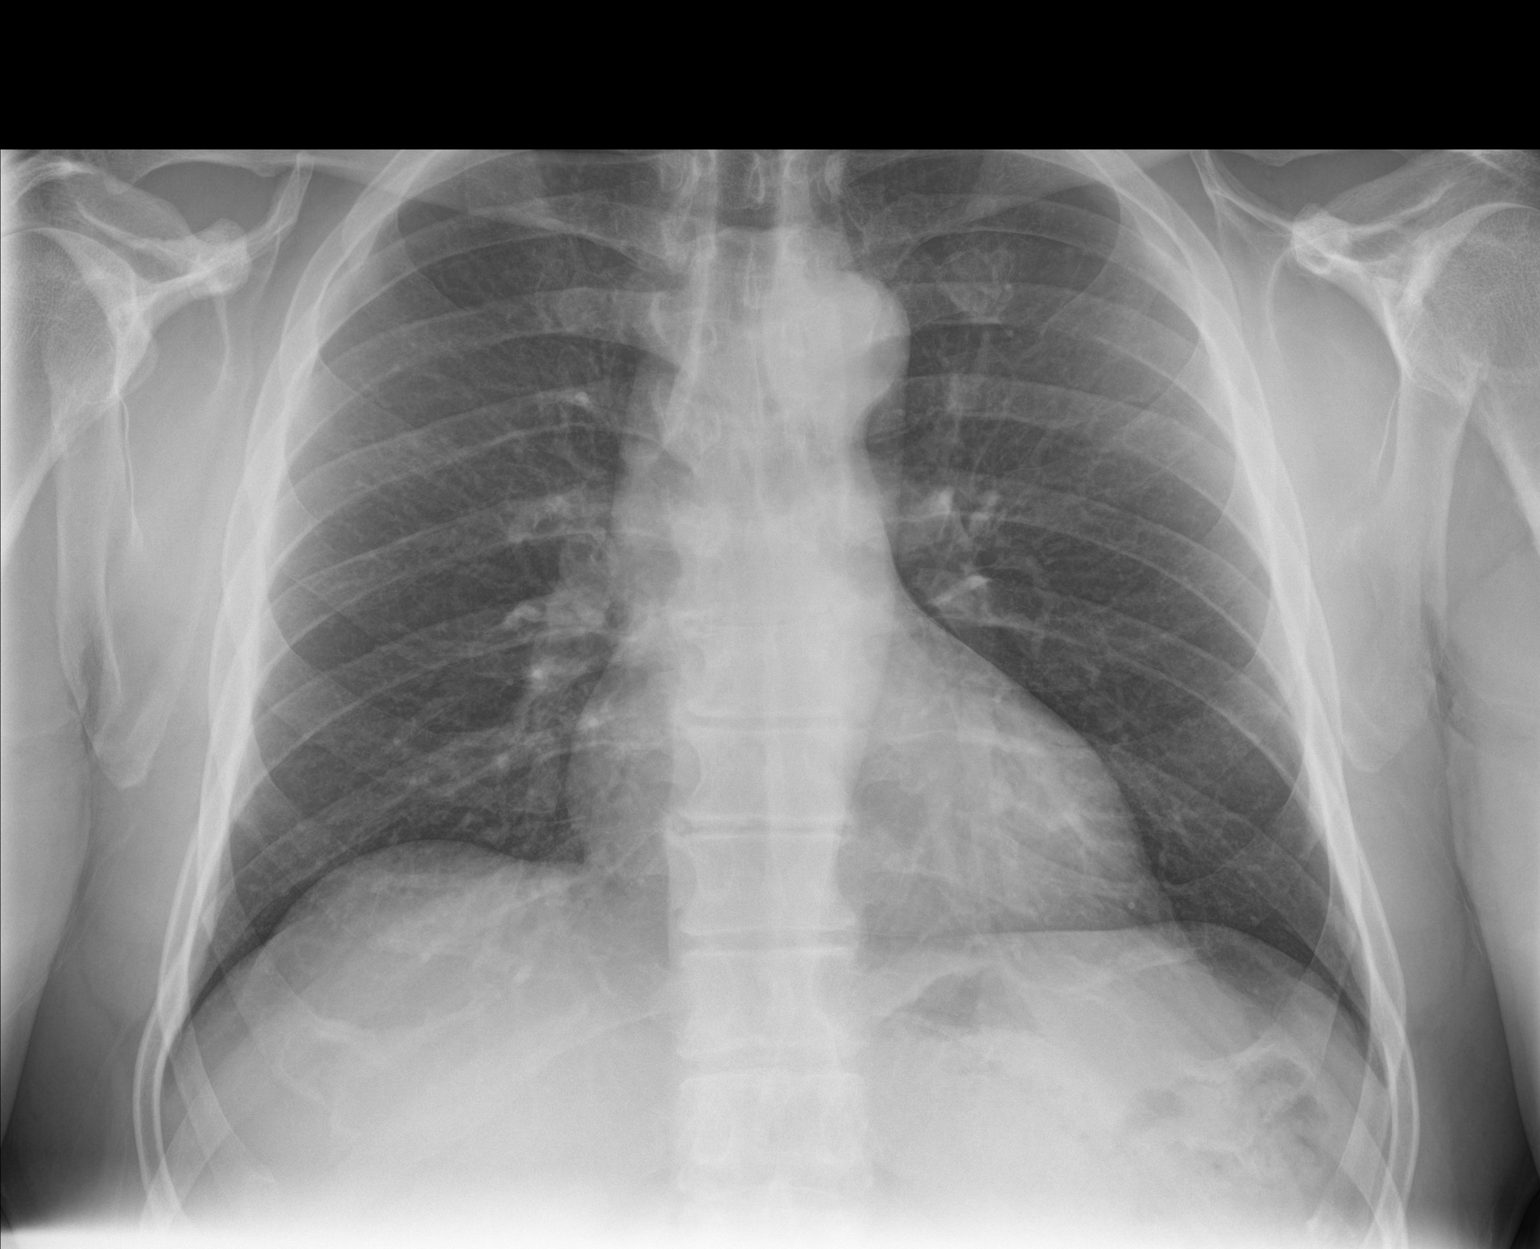

[chest lat]
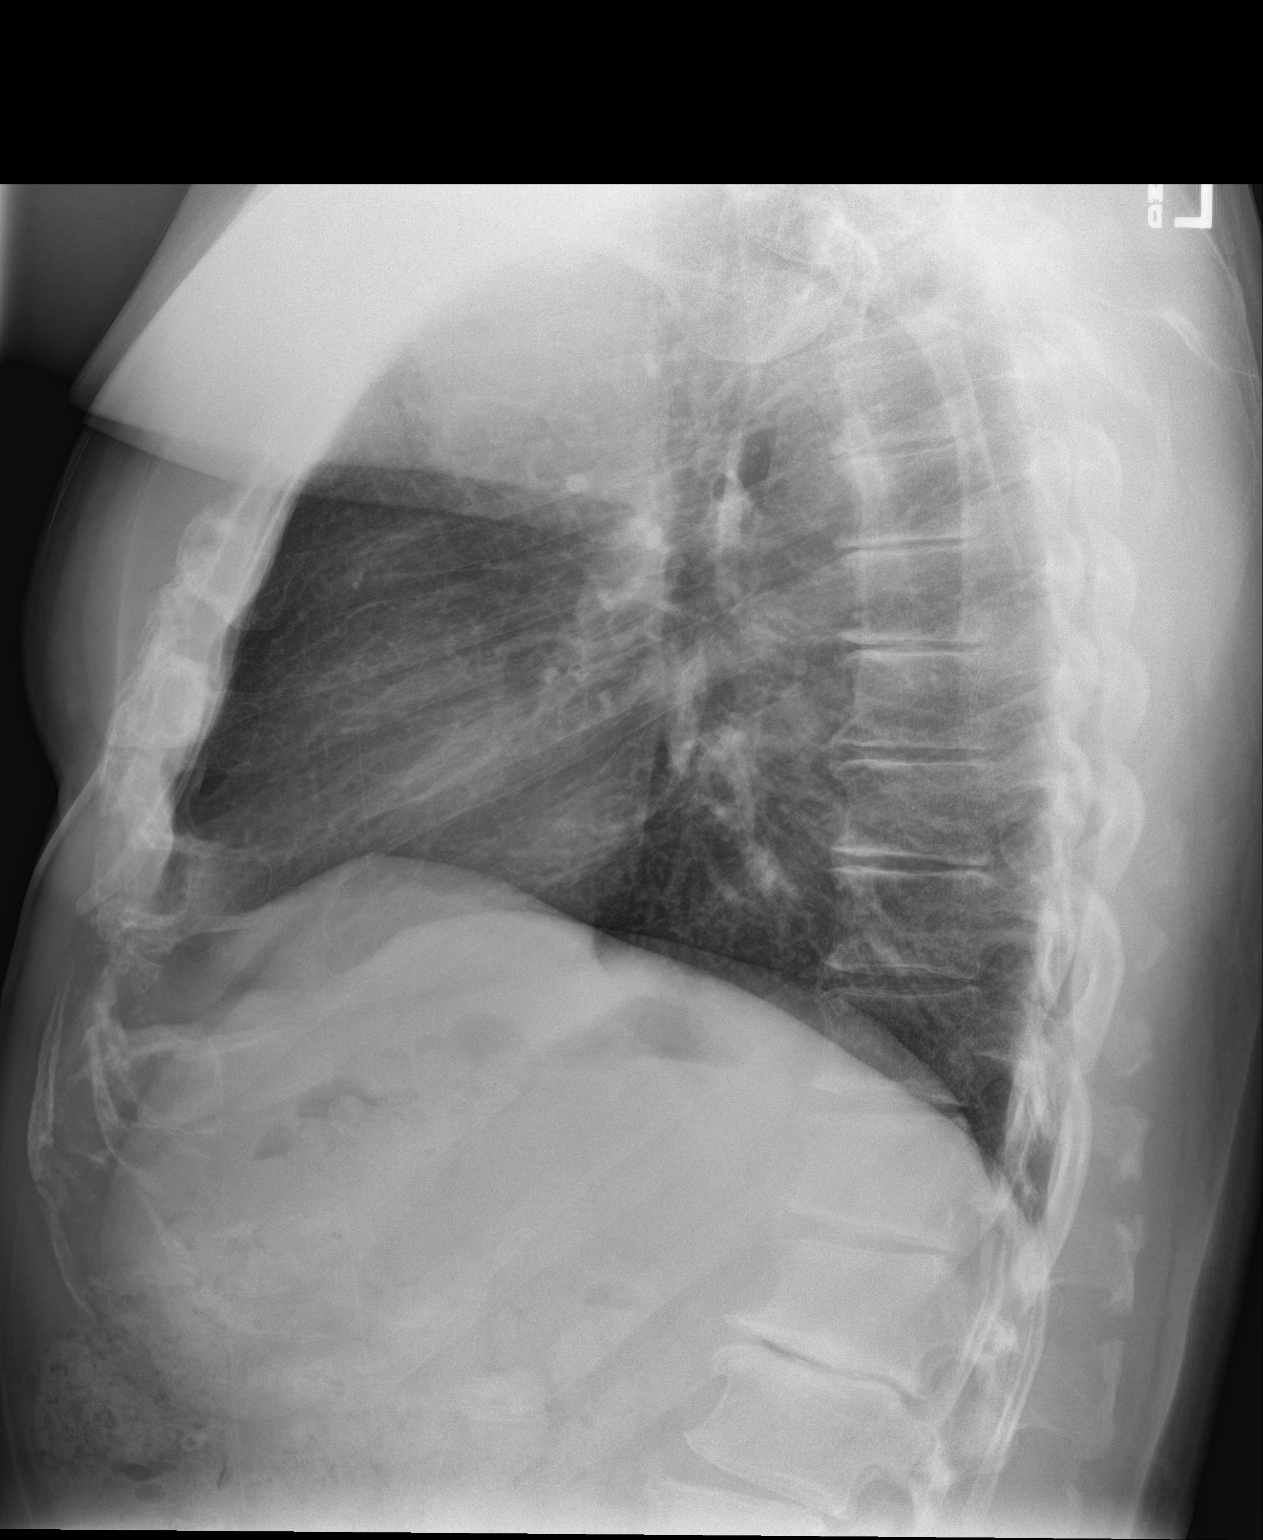

[2 of 2 positions shown; findings below may reference images not displayed]

FINDINGS: The heart size and mediastinal contours are within normal limits.
Both lungs are clear. Stable mild thoracic and lumbar spine
degenerative disc disease.
IMPRESSION: Stable exam.  No active cardiopulmonary disease.

## 2018-07-23 ENCOUNTER — Other Ambulatory Visit: Payer: Self-pay

## 2018-07-23 NOTE — Telephone Encounter (Signed)
Historical medication Last OV: 05/17/2018 Next OV: not scheduled

## 2018-07-24 MED ORDER — MELOXICAM 15 MG PO TABS: 15.0000 mg | ORAL_TABLET | Freq: Every day | ORAL | 0 refills | Status: DC

## 2018-09-03 ENCOUNTER — Ambulatory Visit: Payer: BC Managed Care – PPO | Admitting: Urology

## 2018-09-03 ENCOUNTER — Encounter: Payer: Self-pay | Admitting: Urology

## 2018-09-03 VITALS — BP 150/96 | HR 91 | Ht 70.0 in | Wt 220.7 lb

## 2018-09-03 DIAGNOSIS — N401 Enlarged prostate with lower urinary tract symptoms: Secondary | ICD-10-CM

## 2018-09-03 DIAGNOSIS — R972 Elevated prostate specific antigen [PSA]: Secondary | ICD-10-CM

## 2018-09-03 DIAGNOSIS — R339 Retention of urine, unspecified: Secondary | ICD-10-CM

## 2018-09-03 DIAGNOSIS — Z87438 Personal history of other diseases of male genital organs: Secondary | ICD-10-CM

## 2018-09-03 LAB — BLADDER SCAN AMB NON-IMAGING

## 2018-09-03 LAB — URINALYSIS, COMPLETE
Bilirubin, UA: NEGATIVE
Glucose, UA: NEGATIVE
KETONES UA: NEGATIVE
LEUKOCYTES UA: NEGATIVE
Nitrite, UA: NEGATIVE
PROTEIN UA: NEGATIVE
RBC UA: NEGATIVE
SPEC GRAV UA: 1.01 (ref 1.005–1.030)
Urobilinogen, Ur: 0.2 mg/dL (ref 0.2–1.0)
pH, UA: 6 (ref 5.0–7.5)

## 2018-09-03 MED ORDER — TAMSULOSIN HCL 0.4 MG PO CAPS: 0.4000 mg | ORAL_CAPSULE | Freq: Every day | ORAL | 3 refills | Status: DC

## 2018-09-03 NOTE — Progress Notes (Addendum)
09/03/2018 3:07 PM   Timothy Hines Mar 09, 1966 [id number]  Referring provider: Sherlene Shamsullo, Teresa L, MD [address] [place], [place] [id number]  Chief Complaint  Patient presents with  . Elevated PSA    HPI: [age] year old male seen at request of Dr. Darrick Huntsmanullo for an abnormal PSA.  I saw him back in early February 2019 for acute prostatitis with urinary retention.  He had resolution of his retention when seen in early March 2019.  His PVR was 233 mL.  He was started on tamsulosin and a follow-up PVR in April was 86 mL.  He subsequently discontinued his tamsulosin and is currently taking saw palmetto.  He did have a PSA drawn in September 2019 which was elevated above baseline at 3.83.  His baseline PSA was in the 0.9 range.  He has no bothersome lower urinary tract symptoms.  Denies dysuria, gross hematuria or flank/abdominal/pelvic/scrotal pain.  He is on TRT and his dose has been reduced.   PMH: Past Medical History:  Diagnosis Date  . Anxiety   . GERD (gastroesophageal reflux disease)   . Thyroid disease     Surgical History: Past Surgical History:  Procedure Laterality Date  . KNEE ARTHROSCOPY Left 2013  . NOSE SURGERY    . SHOULDER SURGERY Left april 2016  . THYROID SURGERY    . TONSILLECTOMY  1989    Home Medications:  Allergies as of 09/03/2018   No Known Allergies     Medication List       Accurate as of September 03, 2018  3:07 PM. Always use your most recent med list.        ALPRAZolam 0.25 MG tablet Commonly known as:  XANAX Take 1 tablet (0.25 mg total) by mouth at bedtime as needed for anxiety.   cetirizine 10 MG tablet Commonly known as:  ZYRTEC Take 10 mg by mouth daily.   fluticasone 50 MCG/ACT nasal spray Commonly known as:  FLONASE Place 2 sprays into both nostrils daily.   GLUCOSAMINE CHONDR COMPLEX PO Take by mouth daily.   levothyroxine 125 MCG tablet Commonly known as:  SYNTHROID, LEVOTHROID Take 250 mcg by mouth daily  before breakfast.   meloxicam 15 MG tablet Commonly known as:  MOBIC Take 1 tablet (15 mg total) by mouth daily.   multivitamin capsule Take 1 capsule by mouth daily.   sildenafil 20 MG tablet Commonly known as:  REVATIO Take 20 mg by mouth once a week.   testosterone cypionate 200 MG/ML injection Commonly known as:  DEPOTESTOSTERONE CYPIONATE Inject 0.25 mLs (50 mg total) into the muscle every 14 (fourteen) days.       Allergies: No Known Allergies  Family History: No family history on file.  Social History:  reports that he has never smoked. He has never used smokeless tobacco. He reports current alcohol use of about 7.0 standard drinks of alcohol per week. He reports that he does not use drugs.  ROS: UROLOGY Frequent Urination?: No Hard to postpone urination?: No Burning/pain with urination?: No Get up at night to urinate?: No Leakage of urine?: No Urine stream starts and stops?: No Trouble starting stream?: No Do you have to strain to urinate?: No Blood in urine?: No Urinary tract infection?: No Sexually transmitted disease?: No Injury to kidneys or bladder?: No Painful intercourse?: No Weak stream?: No Erection problems?: No Penile pain?: No  Gastrointestinal Nausea?: No Vomiting?: No Indigestion/heartburn?: Yes Diarrhea?: No Constipation?: No  Constitutional Fever: No Night sweats?: No Weight  loss?: No Fatigue?: No  Skin Skin rash/lesions?: No Itching?: No  Eyes Blurred vision?: No Double vision?: No  Ears/Nose/Throat Sore throat?: No Sinus problems?: No  Hematologic/Lymphatic Swollen glands?: No Easy bruising?: No  Cardiovascular Leg swelling?: No Chest pain?: No  Respiratory Cough?: Yes Shortness of breath?: No  Endocrine Excessive thirst?: No  Musculoskeletal Back pain?: No Joint pain?: Yes  Neurological Headaches?: No Dizziness?: No  Psychologic Depression?: No Anxiety?: No  Physical Exam: BP (!) 150/96 (BP  Location: Left Arm, Patient Position: Sitting, Cuff Size: Large)   Pulse 91   Ht 5\' 10"  (1.778 m)   Wt 220 lb 11.2 oz (100.1 kg)   BMI 31.67 kg/m   Constitutional:  Alert and oriented, No acute distress. HEENT:  AT, moist mucus membranes.  Trachea midline, no masses. Cardiovascular: No clubbing, cyanosis, or edema. Respiratory: Normal respiratory effort, no increased work of breathing. GI: Abdomen is soft, nontender, nondistended, no abdominal masses GU: No CVA tenderness.  Prostate 50 g, smooth without nodules Lymph: No cervical or inguinal lymphadenopathy. Skin: No rashes, bruises or suspicious lesions. Neurologic: Grossly intact, no focal deficits, moving all 4 extremities. Psychiatric: Normal mood and affect.  Laboratory Data:  Urinalysis Dipstick/microscopy negative   Assessment & Plan:   53 year old male with BPH and incomplete bladder emptying.  PVR today was 249 mL.  Patient states he has been holding his urine and finds it more difficult to empty when his bladder is over full.  Will recheck a PVR next week and if it remains elevated would recommend restarting tamsulosin.  Urinalysis today was clear and his PSA was repeated.  If it remains elevated would recommend either prostate biopsy.  We discussed prostate MRI prior to biopsy however it may not be covered by insurance.   Riki Altes, MD  [facility] [address] [place], [place] [phone]

## 2018-09-04 LAB — PSA: Prostate Specific Ag, Serum: 1.8 ng/mL (ref 0.0–4.0)

## 2018-09-11 ENCOUNTER — Ambulatory Visit (INDEPENDENT_AMBULATORY_CARE_PROVIDER_SITE_OTHER): Payer: BC Managed Care – PPO | Admitting: Urology

## 2018-09-11 ENCOUNTER — Encounter: Payer: Self-pay | Admitting: Urology

## 2018-09-11 VITALS — BP 144/92 | HR 77 | Ht 70.0 in | Wt 210.0 lb

## 2018-09-11 DIAGNOSIS — R339 Retention of urine, unspecified: Secondary | ICD-10-CM | POA: Diagnosis not present

## 2018-09-11 LAB — BLADDER SCAN AMB NON-IMAGING: Scan Result: 41

## 2018-09-11 NOTE — Progress Notes (Signed)
PVR- 41

## 2018-09-17 ENCOUNTER — Encounter: Payer: Self-pay | Admitting: Urology

## 2018-09-27 ENCOUNTER — Other Ambulatory Visit: Payer: Self-pay | Admitting: Urology

## 2018-09-27 DIAGNOSIS — R972 Elevated prostate specific antigen [PSA]: Secondary | ICD-10-CM

## 2019-01-30 ENCOUNTER — Other Ambulatory Visit: Payer: BC Managed Care – PPO

## 2019-01-30 ENCOUNTER — Other Ambulatory Visit: Payer: Self-pay

## 2019-01-30 DIAGNOSIS — R972 Elevated prostate specific antigen [PSA]: Secondary | ICD-10-CM

## 2019-01-31 ENCOUNTER — Telehealth: Payer: Self-pay | Admitting: Urology

## 2019-01-31 ENCOUNTER — Telehealth: Payer: Self-pay | Admitting: Family Medicine

## 2019-01-31 LAB — PSA: Prostate Specific Ag, Serum: 1.6 ng/mL (ref 0.0–4.0)

## 2019-01-31 MED ORDER — TAMSULOSIN HCL 0.4 MG PO CAPS: 0.4000 mg | ORAL_CAPSULE | Freq: Every day | ORAL | 6 refills | Status: DC

## 2019-01-31 NOTE — Telephone Encounter (Signed)
Pt wants to know if Dr Bernardo Heater wants him to stay on Flomax or discontinue. Please advise.

## 2019-01-31 NOTE — Telephone Encounter (Signed)
Please advise 

## 2019-01-31 NOTE — Telephone Encounter (Signed)
-----   Message from Abbie Sons, MD sent at 01/31/2019  9:39 AM EDT ----- PSA remains stable at 1.6.  Recommend follow-up January 2021 with PVR.

## 2019-01-31 NOTE — Telephone Encounter (Signed)
I would recommend he continue since he has an elevated PVR off tamsulosin.  Okay to refill until January 2021 if needed.

## 2019-01-31 NOTE — Addendum Note (Signed)
Addended by: Kyra Manges on: 01/31/2019 03:51 PM   Modules accepted: Orders

## 2019-01-31 NOTE — Telephone Encounter (Signed)
Patient notified and appointment scheduled. 

## 2019-02-10 ENCOUNTER — Other Ambulatory Visit: Payer: Self-pay | Admitting: Internal Medicine

## 2019-02-10 DIAGNOSIS — E291 Testicular hypofunction: Secondary | ICD-10-CM

## 2019-02-10 DIAGNOSIS — E890 Postprocedural hypothyroidism: Secondary | ICD-10-CM

## 2019-02-10 DIAGNOSIS — E7800 Pure hypercholesterolemia, unspecified: Secondary | ICD-10-CM

## 2019-02-10 DIAGNOSIS — D751 Secondary polycythemia: Secondary | ICD-10-CM

## 2019-04-11 ENCOUNTER — Other Ambulatory Visit: Payer: Self-pay

## 2019-04-11 ENCOUNTER — Other Ambulatory Visit (INDEPENDENT_AMBULATORY_CARE_PROVIDER_SITE_OTHER): Payer: BC Managed Care – PPO

## 2019-04-11 DIAGNOSIS — D751 Secondary polycythemia: Secondary | ICD-10-CM

## 2019-04-11 DIAGNOSIS — E890 Postprocedural hypothyroidism: Secondary | ICD-10-CM | POA: Diagnosis not present

## 2019-04-11 DIAGNOSIS — E7800 Pure hypercholesterolemia, unspecified: Secondary | ICD-10-CM | POA: Diagnosis not present

## 2019-04-11 DIAGNOSIS — E291 Testicular hypofunction: Secondary | ICD-10-CM

## 2019-04-11 LAB — CBC WITH DIFFERENTIAL/PLATELET
Basophils Absolute: 0 10*3/uL (ref 0.0–0.1)
Basophils Relative: 0.8 % (ref 0.0–3.0)
Eosinophils Absolute: 0.2 10*3/uL (ref 0.0–0.7)
Eosinophils Relative: 4.3 % (ref 0.0–5.0)
HCT: 44.5 % (ref 39.0–52.0)
Hemoglobin: 15.2 g/dL (ref 13.0–17.0)
Lymphocytes Relative: 20.4 % (ref 12.0–46.0)
Lymphs Abs: 1 10*3/uL (ref 0.7–4.0)
MCHC: 34.2 g/dL (ref 30.0–36.0)
MCV: 90.8 fl (ref 78.0–100.0)
Monocytes Absolute: 0.4 10*3/uL (ref 0.1–1.0)
Monocytes Relative: 7.7 % (ref 3.0–12.0)
Neutro Abs: 3.2 10*3/uL (ref 1.4–7.7)
Neutrophils Relative %: 66.8 % (ref 43.0–77.0)
Platelets: 272 10*3/uL (ref 150.0–400.0)
RBC: 4.91 Mil/uL (ref 4.22–5.81)
RDW: 13.5 % (ref 11.5–15.5)
WBC: 4.7 10*3/uL (ref 4.0–10.5)

## 2019-04-11 LAB — LIPID PANEL
Cholesterol: 221 mg/dL — ABNORMAL HIGH (ref 0–200)
HDL: 47.8 mg/dL (ref >39.00)
LDL Cholesterol: 153 mg/dL — ABNORMAL HIGH (ref 0–99)
NonHDL: 173.21
Total CHOL/HDL Ratio: 5
Triglycerides: 102 mg/dL (ref 0.0–149.0)
VLDL: 20.4 mg/dL (ref 0.0–40.0)

## 2019-04-11 LAB — COMPREHENSIVE METABOLIC PANEL
ALT: 23 U/L (ref 0–53)
AST: 21 U/L (ref 0–37)
Albumin: 4.8 g/dL (ref 3.5–5.2)
Alkaline Phosphatase: 40 U/L (ref 39–117)
BUN: 18 mg/dL (ref 6–23)
CO2: 26 mEq/L (ref 19–32)
Calcium: 9.1 mg/dL (ref 8.4–10.5)
Chloride: 105 mEq/L (ref 96–112)
Creatinine, Ser: 0.98 mg/dL (ref 0.40–1.50)
GFR: 79.99 mL/min (ref >60.00)
Glucose, Bld: 105 mg/dL — ABNORMAL HIGH (ref 70–99)
Potassium: 4.3 mEq/L (ref 3.5–5.1)
Sodium: 139 mEq/L (ref 135–145)
Total Bilirubin: 1.3 mg/dL — ABNORMAL HIGH (ref 0.2–1.2)
Total Protein: 6.7 g/dL (ref 6.0–8.3)

## 2019-04-11 LAB — TSH: TSH: 0.3 u[IU]/mL — ABNORMAL LOW (ref 0.35–4.50)

## 2019-04-16 LAB — TESTOS,TOTAL,FREE AND SHBG (FEMALE)
Free Testosterone: 100.1 pg/mL (ref 35.0–155.0)
Sex Hormone Binding: 10 nmol/L (ref 10–50)
Testosterone, Total, LC-MS-MS: 439 ng/dL (ref 250–1100)

## 2019-05-27 ENCOUNTER — Ambulatory Visit: Payer: BC Managed Care – PPO | Admitting: Internal Medicine

## 2019-06-02 ENCOUNTER — Other Ambulatory Visit: Payer: Self-pay

## 2019-06-02 ENCOUNTER — Encounter: Payer: Self-pay | Admitting: Internal Medicine

## 2019-06-02 ENCOUNTER — Ambulatory Visit (INDEPENDENT_AMBULATORY_CARE_PROVIDER_SITE_OTHER): Payer: BC Managed Care – PPO | Admitting: Internal Medicine

## 2019-06-02 VITALS — Ht 70.0 in | Wt 210.0 lb

## 2019-06-02 DIAGNOSIS — F409 Phobic anxiety disorder, unspecified: Secondary | ICD-10-CM

## 2019-06-02 DIAGNOSIS — F5105 Insomnia due to other mental disorder: Secondary | ICD-10-CM

## 2019-06-02 DIAGNOSIS — E291 Testicular hypofunction: Secondary | ICD-10-CM | POA: Diagnosis not present

## 2019-06-02 DIAGNOSIS — E890 Postprocedural hypothyroidism: Secondary | ICD-10-CM | POA: Diagnosis not present

## 2019-06-02 DIAGNOSIS — J029 Acute pharyngitis, unspecified: Secondary | ICD-10-CM | POA: Insufficient documentation

## 2019-06-02 DIAGNOSIS — E7800 Pure hypercholesterolemia, unspecified: Secondary | ICD-10-CM | POA: Diagnosis not present

## 2019-06-02 DIAGNOSIS — Z1211 Encounter for screening for malignant neoplasm of colon: Secondary | ICD-10-CM

## 2019-06-02 MED ORDER — MELOXICAM 15 MG PO TABS: 15.0000 mg | ORAL_TABLET | Freq: Every day | ORAL | 0 refills | Status: DC

## 2019-06-02 MED ORDER — METHOCARBAMOL 500 MG PO TABS: 500.0000 mg | ORAL_TABLET | Freq: Three times a day (TID) | ORAL | 2 refills | Status: DC

## 2019-06-02 MED ORDER — TESTOSTERONE CYPIONATE 200 MG/ML IM SOLN: 50.0000 mg | INTRAMUSCULAR | 0 refills | Status: DC

## 2019-06-02 MED ORDER — ALPRAZOLAM 0.5 MG PO TABS: 0.5000 mg | ORAL_TABLET | Freq: Every evening | ORAL | 5 refills | Status: DC | PRN

## 2019-06-02 NOTE — Assessment & Plan Note (Signed)
10 yr risk using FRC is 12% .  30 pt jump in LDL since last year noted  .  Will address with diet and repeat in 6 months   Lab Results  Component Value Date   CHOL 221 (H) 04/11/2019   HDL 47.80 04/11/2019   LDLCALC 153 (H) 04/11/2019   TRIG 102.0 04/11/2019   CHOLHDL 5 04/11/2019

## 2019-06-02 NOTE — Assessment & Plan Note (Signed)
Managed with 0.25 ml  every 2 weeks.

## 2019-06-02 NOTE — Progress Notes (Signed)
Virtual Visit via [email or web address]  This visit type was conducted due to national recommendations for restrictions regarding the COVID-19 pandemic (e.g. social distancing).  This format is felt to be most appropriate for this patient at this time.  All issues noted in this document were discussed and addressed.  No physical exam was performed (except for noted visual exam findings with Video Visits).   I connected with@ on 06/02/19 at  2:30 PM EDT by a video enabled telemedicine application  and verified that I am speaking with the correct person using two identifiers. Location patient: home Location provider: work or home office Persons participating in the virtual visit: patient, provider  I discussed the limitations, risks, security and privacy concerns of performing an evaluation and management service by telephone and the availability of in person appointments. I also discussed with the patient that there may be a patient responsible charge related to this service. The patient expressed understanding and agreed to proceed.  Reason for visit: medication refill,  Follow up on chronic conditions,   Sore throat of one day's duration   HPI:  53 yr old male with low testosterone, BPH,  Postsurgical  Hypothyroidism,   Last TSH suppressed one month ago but was not having any overt hyperthyroid symptoms doing fine  Had a fall one month ago onto back and elbow.  Knocked down by His labradors. Using meloxicam  Sees Urology for PSA and BPH  Using alprazolam 2-3 times per week for insomnia . 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   Donating blood ,  Last donation was in July .  He is  Taking testosterone for low T  0.25 ml every 14 days   Sore throat ,  No fevers or chills,  Feels it occurred during straining workout.     ROS: See pertinent positives and negatives per HPI.  Past Medical History:  Diagnosis Date  . Anxiety   . GERD  (gastroesophageal reflux disease)   . Thyroid disease     Past Surgical History:  Procedure Laterality Date  . KNEE ARTHROSCOPY Left 2013  . NOSE SURGERY    . SHOULDER SURGERY Left april 2016  . THYROID SURGERY    . TONSILLECTOMY  1989    No family history on file.  SOCIAL HX: * reports that he has never smoked. He has never used smokeless tobacco. He reports current alcohol use of about 7.0 standard drinks of alcohol per week. He reports that he does not use drugs.  Current Outpatient Medications:  .  ALPRAZolam (XANAX) 0.5 MG tablet, Take 1 tablet (0.5 mg total) by mouth at bedtime as needed for anxiety., Disp: 30 tablet, Rfl: 5 .  cetirizine (ZYRTEC) 10 MG tablet, Take 10 mg by mouth daily., Disp: , Rfl:  .  fluticasone (FLONASE) 50 MCG/ACT nasal spray, Place 2 sprays into both nostrils daily., Disp: 16 g, Rfl: 6 .  Glucosamine-Chondroitin (GLUCOSAMINE CHONDR COMPLEX PO), Take by mouth daily., Disp: , Rfl:  .  levothyroxine (SYNTHROID, LEVOTHROID) 125 MCG tablet, Take 250 mcg by mouth daily before breakfast. , Disp: , Rfl:  .  meloxicam (MOBIC) 15 MG tablet, Take 1 tablet (15 mg total) by mouth daily., Disp: 90 tablet, Rfl: 0 .  methocarbamol (ROBAXIN) 500 MG tablet, Take 1 tablet (500 mg total) by mouth 3 (three) times daily., Disp: 60 tablet, Rfl: 2 .  Multiple Vitamin (MULTIVITAMIN) capsule, Take 1 capsule by mouth  daily., Disp: , Rfl:  .  sildenafil (REVATIO) 20 MG tablet, Take 20 mg by mouth once a week., Disp: , Rfl:  .  tamsulosin (FLOMAX) 0.4 MG CAPS capsule, Take 1 capsule (0.4 mg total) by mouth daily., Disp: 30 capsule, Rfl: 6 .  testosterone cypionate (DEPOTESTOSTERONE CYPIONATE) 200 MG/ML injection, Inject 0.25 mLs (50 mg total) into the muscle every 14 (fourteen) days., Disp: 10 mL, Rfl: 0  EXAM:  VITALS per patient if applicable:  GENERAL: alert, oriented, appears well and in no acute distress  HEENT: atraumatic, conjunttiva clear, no obvious abnormalities on  inspection of external nose and ears  NECK: normal movements of the head and neck  LUNGS: on inspection no signs of respiratory distress, breathing rate appears normal, no obvious gross SOB, gasping or wheezing  CV: no obvious cyanosis  MS: moves all visible extremities without noticeable abnormality  PSYCH/NEURO: pleasant and cooperative, no obvious depression or anxiety, speech and thought processing grossly intact  ASSESSMENT AND PLAN:  Discussed the following assessment and plan:  Hypogonadism in male - Plan: testosterone cypionate (DEPOTESTOSTERONE CYPIONATE) 200 MG/ML injection  Postsurgical hypothyroidism  Pure hypercholesterolemia  Insomnia due to anxiety and fear  Sore throat  Postsurgical hypothyroidism Thyroid function is bordering on hyperactive but he is asymptomatic  on current dose.  No current changes needed.   Lab Results  Component Value Date   TSH 0.30 (L) 04/11/2019     Hyperlipidemia 10 yr risk using FRC is 12% .  30 pt jump in LDL since last year noted  .  Will address with diet and repeat in 6 months   Lab Results  Component Value Date   CHOL 221 (H) 04/11/2019   HDL 47.80 04/11/2019   LDLCALC 153 (H) 04/11/2019   TRIG 102.0 04/11/2019   CHOLHDL 5 04/11/2019    Hypogonadism in male Managed with 0.25 ml  every 2 weeks.   Insomnia due to anxiety and fear Managed with prn use of alprazolam. The risks and benefits of benzodiazepine use were reviewed with patient today including excessive sedation leading to respiratory depression,  impaired thinking/driving, and addiction.  Patient was advised to avoid concurrent use with alcohol, to use medication only as needed and not to share with others  .    Sore throat Per patient, he does not feel ill.  Will monitor symptoms for now.     I discussed the assessment and treatment plan with the patient. The patient was provided an opportunity to ask questions and all were answered. The patient agreed  with the plan and demonstrated an understanding of the instructions.   The patient was advised to call back or seek an in-person evaluation if the symptoms worsen or if the condition fails to improve as anticipated.   I provided  25 minutes of non-face-to-face time during this encounter reviewing patient's current problems,  Providing counseling on the above mentioned problems , and coordination  of care .  Sherlene Shams, MD

## 2019-06-02 NOTE — Assessment & Plan Note (Signed)
Managed with prn use of alprazolam. The risks and benefits of benzodiazepine use were reviewed with patient today including excessive sedation leading to respiratory depression,  impaired thinking/driving, and addiction.  Patient was advised to avoid concurrent use with alcohol, to use medication only as needed and not to share with others  .

## 2019-06-02 NOTE — Assessment & Plan Note (Signed)
Per patient, he does not feel ill.  Will monitor symptoms for now.

## 2019-06-02 NOTE — Assessment & Plan Note (Addendum)
Thyroid function is bordering on hyperactive but he is asymptomatic  on current dose.  No current changes needed.   Lab Results  Component Value Date   TSH 0.30 (L) 04/11/2019

## 2019-06-03 NOTE — Addendum Note (Signed)
Addended by: [name] on: 06/03/2019 01:16 PM   Modules accepted: Orders

## 2019-06-04 ENCOUNTER — Telehealth: Payer: Self-pay

## 2019-06-04 NOTE — Telephone Encounter (Signed)
PA for testosterone has been submitted on covermymeds.  

## 2019-06-13 ENCOUNTER — Telehealth: Payer: Self-pay

## 2019-06-13 NOTE — Telephone Encounter (Signed)
PA for testosterone has been submitted on covermymeds.  

## 2019-06-25 LAB — COLOGUARD: Cologuard: NEGATIVE

## 2019-06-27 ENCOUNTER — Encounter: Payer: Self-pay | Admitting: Internal Medicine

## 2019-07-01 ENCOUNTER — Telehealth: Payer: Self-pay | Admitting: Internal Medicine

## 2019-07-01 ENCOUNTER — Other Ambulatory Visit: Payer: Self-pay

## 2019-07-01 ENCOUNTER — Other Ambulatory Visit: Payer: Self-pay | Admitting: Internal Medicine

## 2019-07-01 DIAGNOSIS — Z20822 Contact with and (suspected) exposure to covid-19: Secondary | ICD-10-CM

## 2019-07-01 DIAGNOSIS — E291 Testicular hypofunction: Secondary | ICD-10-CM

## 2019-07-01 DIAGNOSIS — Z20828 Contact with and (suspected) exposure to other viral communicable diseases: Secondary | ICD-10-CM

## 2019-07-01 MED ORDER — TESTOSTERONE CYPIONATE 200 MG/ML IM SOLN: 50.0000 mg | INTRAMUSCULAR | 0 refills | Status: DC

## 2019-07-01 NOTE — Assessment & Plan Note (Signed)
Testing recommended.  Asymptomatic

## 2019-07-01 NOTE — Telephone Encounter (Signed)
Testosterone approved until 06/30/2022 By [facility]. PA# [place] [phone] Non-grandfathered [id number] YK> Patient has been notified.

## 2019-07-03 LAB — NOVEL CORONAVIRUS, NAA: SARS-CoV-2, NAA: NOT DETECTED

## 2019-07-09 ENCOUNTER — Encounter: Payer: Self-pay | Admitting: Urology

## 2019-08-25 ENCOUNTER — Ambulatory Visit: Payer: Self-pay | Admitting: Urology

## 2019-08-29 ENCOUNTER — Other Ambulatory Visit: Payer: Self-pay

## 2019-08-29 ENCOUNTER — Encounter: Payer: Self-pay | Admitting: Urology

## 2019-08-29 ENCOUNTER — Ambulatory Visit: Payer: BC Managed Care – PPO | Admitting: Urology

## 2019-08-29 VITALS — BP 131/80 | HR 99 | Ht 70.0 in | Wt 216.7 lb

## 2019-08-29 DIAGNOSIS — N138 Other obstructive and reflux uropathy: Secondary | ICD-10-CM | POA: Diagnosis not present

## 2019-08-29 DIAGNOSIS — N401 Enlarged prostate with lower urinary tract symptoms: Secondary | ICD-10-CM | POA: Diagnosis not present

## 2019-08-29 DIAGNOSIS — R339 Retention of urine, unspecified: Secondary | ICD-10-CM | POA: Diagnosis not present

## 2019-08-29 LAB — BLADDER SCAN AMB NON-IMAGING

## 2019-08-29 NOTE — Progress Notes (Signed)
08/29/2019 2:20 PM   Timothy Hines [birth year] [id number]  Referring provider: [name], MD [address] [place],  [place] [address]  Chief Complaint  Patient presents with  . Follow-up    Urologic history: 1.  History urinary retention -In conjunction with acute prostatitis January 2019  -Follow-up PVR 10/2017 233 mL -Started tamsulosin with improvement PVR  2.  Hypogonadism -On TRT managed by Dr. Derrel Hines  3.  Erectile dysfunction -Sildenafil prn   HPI: 54 y.o. male presents for follow-up.  I last saw him January 2020 after PSA bumped to 3.83.  His repeat PSA returned to baseline and was 1.8 at that visit and was 1.7 on repeat June 2020.  He denies bothersome lower urinary tract symptoms and remains on tamsulosin.  He is voiding with a good stream.  Denies dysuria or gross hematuria.   PMH: Past Medical History:  Diagnosis Date  . Anxiety   . GERD (gastroesophageal reflux disease)   . Thyroid disease     Surgical History: Past Surgical History:  Procedure Laterality Date  . KNEE ARTHROSCOPY Left 2013  . NOSE SURGERY    . SHOULDER SURGERY Left april 2016  . THYROID SURGERY    . TONSILLECTOMY  1989    Home Medications:  Allergies as of 08/29/2019   No Known Allergies     Medication List       Accurate as of August 29, 2019  2:20 PM. If you have any questions, ask your nurse or doctor.        ALPRAZolam 0.5 MG tablet Commonly known as: Xanax Take 1 tablet (0.5 mg total) by mouth at bedtime as needed for anxiety.   cetirizine 10 MG tablet Commonly known as: ZYRTEC Take 10 mg by mouth daily.   fluticasone 50 MCG/ACT nasal spray Commonly known as: FLONASE Place 2 sprays into both nostrils daily. What changed:   when to take this  reasons to take this   [place] Take by mouth daily.   levothyroxine 125 MCG tablet Commonly known as: SYNTHROID Take 250 mcg by mouth daily before breakfast.     meloxicam 15 MG tablet Commonly known as: MOBIC Take 1 tablet (15 mg total) by mouth daily.   methocarbamol 500 MG tablet Commonly known as: ROBAXIN Take 1 tablet (500 mg total) by mouth 3 (three) times daily. What changed:   when to take this  reasons to take this   multivitamin capsule Take 1 capsule by mouth daily.   sildenafil 20 MG tablet Commonly known as: REVATIO Take 20 mg by mouth once a week.   tamsulosin 0.4 MG Caps capsule Commonly known as: FLOMAX Take 1 capsule (0.4 mg total) by mouth daily.   testosterone cypionate 200 MG/ML injection Commonly known as: DEPOTESTOSTERONE CYPIONATE Inject 0.25 mLs (50 mg total) into the muscle every 14 (fourteen) days.       Allergies: No Known Allergies  Family History: No family history on file.  Social History:  reports that he has never smoked. He has never used smokeless tobacco. He reports current alcohol use of about 7.0 standard drinks of alcohol per week. He reports that he does not use drugs.  ROS: UROLOGY Frequent Urination?: No Hard to postpone urination?: No Burning/pain with urination?: No Get up at night to urinate?: No Leakage of urine?: No Urine stream starts and stops?: No Trouble starting stream?: No Do you have to strain to urinate?: No Blood in urine?: No Urinary  tract infection?: No Sexually transmitted disease?: No Injury to kidneys or bladder?: No Painful intercourse?: No Weak stream?: No Erection problems?: No Penile pain?: No  Gastrointestinal Nausea?: No Vomiting?: No Indigestion/heartburn?: No Diarrhea?: No Constipation?: No  Constitutional Fever: No Night sweats?: No Weight loss?: No Fatigue?: No  Skin Skin rash/lesions?: No Itching?: No  Eyes Blurred vision?: No Double vision?: No  Ears/Nose/Throat Sore throat?: No Sinus problems?: No  Hematologic/Lymphatic Swollen glands?: No Easy bruising?: No  Cardiovascular Leg swelling?: No Chest pain?:  No  Respiratory Cough?: No Shortness of breath?: No  Endocrine Excessive thirst?: No  Musculoskeletal Back pain?: Yes Joint pain?: No  Neurological Headaches?: No Dizziness?: No  Psychologic Depression?: No Anxiety?: No  Physical Exam: BP 131/80 (BP Location: Left Arm, Patient Position: Sitting, Cuff Size: Large)   Pulse 99   Ht 5\' 10"  (1.778 m)   Wt 216 lb 11.2 oz (98.3 kg)   BMI 31.09 kg/m   Constitutional:  Alert and oriented, No acute distress. HEENT: [place] AT, moist mucus membranes.  Trachea midline, no masses. Cardiovascular: No clubbing, cyanosis, or edema. Respiratory: Normal respiratory effort, no increased work of breathing. Skin: No rashes, bruises or suspicious lesions. Neurologic: Grossly intact, no focal deficits, moving all 4 extremities. Psychiatric: Normal mood and affect.   Assessment & Plan:    - BPH with lower urinary tract symptoms Doing well on tamsulosin.  He did not need a refill.  PVR by bladder scan today was 31 mL.  Continue annual follow-up here and would recommend annual PSA with Dr. .    Timothy Huntsman, MD  [facility] [address] [place], [place] [phone]

## 2020-05-05 ENCOUNTER — Encounter: Payer: Self-pay | Admitting: Internal Medicine

## 2020-05-05 ENCOUNTER — Ambulatory Visit (INDEPENDENT_AMBULATORY_CARE_PROVIDER_SITE_OTHER): Payer: BC Managed Care – PPO | Admitting: Internal Medicine

## 2020-05-05 ENCOUNTER — Other Ambulatory Visit: Payer: Self-pay

## 2020-05-05 VITALS — BP 146/92 | Temp 98.6°F | Resp 15 | Ht 70.0 in | Wt 216.4 lb

## 2020-05-05 DIAGNOSIS — E291 Testicular hypofunction: Secondary | ICD-10-CM

## 2020-05-05 DIAGNOSIS — Z0000 Encounter for general adult medical examination without abnormal findings: Secondary | ICD-10-CM | POA: Diagnosis not present

## 2020-05-05 DIAGNOSIS — E890 Postprocedural hypothyroidism: Secondary | ICD-10-CM

## 2020-05-05 DIAGNOSIS — Z20822 Contact with and (suspected) exposure to covid-19: Secondary | ICD-10-CM | POA: Diagnosis not present

## 2020-05-05 DIAGNOSIS — Z125 Encounter for screening for malignant neoplasm of prostate: Secondary | ICD-10-CM | POA: Diagnosis not present

## 2020-05-05 DIAGNOSIS — D751 Secondary polycythemia: Secondary | ICD-10-CM | POA: Diagnosis not present

## 2020-05-05 DIAGNOSIS — R030 Elevated blood-pressure reading, without diagnosis of hypertension: Secondary | ICD-10-CM | POA: Diagnosis not present

## 2020-05-05 DIAGNOSIS — Z9189 Other specified personal risk factors, not elsewhere classified: Secondary | ICD-10-CM

## 2020-05-05 DIAGNOSIS — E7800 Pure hypercholesterolemia, unspecified: Secondary | ICD-10-CM

## 2020-05-05 LAB — COMPREHENSIVE METABOLIC PANEL
ALT: 26 U/L (ref 0–53)
AST: 25 U/L (ref 0–37)
Albumin: 5.1 g/dL (ref 3.5–5.2)
Alkaline Phosphatase: 47 U/L (ref 39–117)
BUN: 20 mg/dL (ref 6–23)
CO2: 28 mEq/L (ref 19–32)
Calcium: 9.6 mg/dL (ref 8.4–10.5)
Chloride: 102 mEq/L (ref 96–112)
Creatinine, Ser: 1.08 mg/dL (ref 0.40–1.50)
GFR: 71.22 mL/min (ref >60.00)
Glucose, Bld: 96 mg/dL (ref 70–99)
Potassium: 4.4 mEq/L (ref 3.5–5.1)
Sodium: 139 mEq/L (ref 135–145)
Total Bilirubin: 1.1 mg/dL (ref 0.2–1.2)
Total Protein: 7.5 g/dL (ref 6.0–8.3)

## 2020-05-05 LAB — CBC WITH DIFFERENTIAL/PLATELET
Basophils Absolute: 0 10*3/uL (ref 0.0–0.1)
Basophils Relative: 1.3 % (ref 0.0–3.0)
Eosinophils Absolute: 0 10*3/uL (ref 0.0–0.7)
Eosinophils Relative: 0.9 % (ref 0.0–5.0)
HCT: 48.1 % (ref 39.0–52.0)
Hemoglobin: 16.2 g/dL (ref 13.0–17.0)
Lymphocytes Relative: 28 % (ref 12.0–46.0)
Lymphs Abs: 1 10*3/uL (ref 0.7–4.0)
MCHC: 33.6 g/dL (ref 30.0–36.0)
MCV: 91.7 fl (ref 78.0–100.0)
Monocytes Absolute: 0.3 10*3/uL (ref 0.1–1.0)
Monocytes Relative: 9.2 % (ref 3.0–12.0)
Neutro Abs: 2.3 10*3/uL (ref 1.4–7.7)
Neutrophils Relative %: 60.6 % (ref 43.0–77.0)
Platelets: 279 10*3/uL (ref 150.0–400.0)
RBC: 5.24 Mil/uL (ref 4.22–5.81)
RDW: 13.2 % (ref 11.5–15.5)
WBC: 3.7 10*3/uL — ABNORMAL LOW (ref 4.0–10.5)

## 2020-05-05 LAB — PSA: PSA: 1.38 ng/mL (ref 0.10–4.00)

## 2020-05-05 LAB — LIPID PANEL
Cholesterol: 231 mg/dL — ABNORMAL HIGH (ref 0–200)
HDL: 52.4 mg/dL (ref >39.00)
LDL Cholesterol: 162 mg/dL — ABNORMAL HIGH (ref 0–99)
NonHDL: 178.12
Total CHOL/HDL Ratio: 4
Triglycerides: 83 mg/dL (ref 0.0–149.0)
VLDL: 16.6 mg/dL (ref 0.0–40.0)

## 2020-05-05 LAB — TESTOSTERONE: Testosterone: 378.9 ng/dL (ref 300.00–890.00)

## 2020-05-05 LAB — MICROALBUMIN / CREATININE URINE RATIO
Creatinine,U: 49.2 mg/dL
Microalb Creat Ratio: 1.4 mg/g (ref 0.0–30.0)
Microalb, Ur: <0.7 mg/dL (ref 0.0–1.9)

## 2020-05-05 MED ORDER — MELOXICAM 7.5 MG PO TABS: 7.5000 mg | ORAL_TABLET | Freq: Two times a day (BID) | ORAL | 11 refills | Status: DC | PRN

## 2020-05-05 MED ORDER — METHOCARBAMOL 500 MG PO TABS
500.0000 mg | ORAL_TABLET | Freq: Three times a day (TID) | ORAL | 11 refills | Status: DC | PRN
Start: 2020-05-05 — End: 2021-10-19

## 2020-05-05 MED ORDER — ALPRAZOLAM 0.5 MG PO TABS: 0.5000 mg | ORAL_TABLET | Freq: Every evening | ORAL | 5 refills | Status: DC | PRN

## 2020-05-05 NOTE — Assessment & Plan Note (Addendum)
He has no prior history of hypertension. He will check his blood pressure several times over the next 3-4 weeks and to submit readings for evaluation. Urine microalbumin to creatinine ratio done today is pending.  

## 2020-05-05 NOTE — Progress Notes (Signed)
Patient ID: Timothy Hines, male    DOB: 11-24-1965  Age: 54 y.o. MRN: [id number]  The patient is here for annual preventive examination and management of other chronic and acute problems.   The risk factors are reflected in the social history.  The roster of all physicians providing medical care to patient - is listed in the Snapshot section of the chart.  Activities of daily living:  The patient is 100% independent in all ADLs: dressing, toileting, feeding as well as independent mobility  Home safety : The patient has smoke detectors in the home. They wear seatbelts.  There are no firearms at home. There is no violence in the home.   There is no risks for hepatitis, STDs or HIV. There is no   history of blood transfusion. They have no travel history to infectious disease endemic areas of the world.  The patient has seen their dentist in the last six month. They have seen their eye doctor in the last year. He denies  hearing difficulty with regard to whispered voices and some television programs.  They have deferred audiologic testing in the last year.  They do not  have excessive sun exposure. Discussed the need for sun protection: hats, long sleeves and use of sunscreen if there is significant sun exposure.   Diet: the importance of a healthy diet is discussed. He has a healthy diet.  The benefits of regular aerobic exercise were discussed. 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   Depression screen: there are no signs or vegative symptoms of depression- irritability, change in appetite, anhedonia, sadness/tearfullness.  The following portions of the patient's history were reviewed and updated as appropriate: allergies, current medications, past family history, past medical history,  past surgical history, past social history  and problem list.  Visual acuity was not assessed per patient preference since she has regular follow up with her ophthalmologist. Hearing and  body mass index were assessed and reviewed.   During the course of the visit the patient was educated and counseled about appropriate screening and preventive services including : fall prevention , diabetes screening, nutrition counseling, colorectal cancer screening, and recommended immunizations.    CC: The primary encounter diagnosis was Postsurgical hypothyroidism. Diagnoses of Pure hypercholesterolemia, Polycythemia, secondary, Prostate cancer screening, Hypogonadism in male, At increased risk of exposure to COVID-19 virus, Elevated blood pressure reading in office without diagnosis of hypertension, Encounter for preventive health examination, Close exposure to COVID-19 virus, and Elevated blood-pressure reading, without diagnosis of hypertension were also pertinent to this visit.  1) fatigue.  Thyroid was overactive by recent ENT follow up [facility] 0.2  Dose reduced by 25 mcg per week . Getting 1.5 less hours of sleep per night,  Snoring but not apneic per wife.    2) elevated BP:  Was elevated last week at ENT but had been taking sudafed   History Timothy Hines has a past medical history of Anxiety, GERD (gastroesophageal reflux disease), and Thyroid disease.   He has a past surgical history that includes Thyroid surgery; Shoulder surgery (Left, april 2016); Knee arthroscopy (Left, 2013); Tonsillectomy (1989); and Nose surgery.   His family history is not on file.He reports that he has never smoked. He has never used smokeless tobacco. He reports current alcohol use of about 7.0 standard drinks of alcohol per week. He reports that he does not use drugs.  Outpatient Medications Prior to Visit  Medication Sig Dispense Refill  . cetirizine (ZYRTEC)  10 MG tablet Take 10 mg by mouth daily.    . fluticasone (FLONASE) 50 MCG/ACT nasal spray Place 2 sprays into both nostrils daily. (Patient taking differently: Place 2 sprays into both nostrils as needed. ) 16 g 6  . Glucosamine-Chondroitin (GLUCOSAMINE  CHONDR COMPLEX PO) Take by mouth daily.    Marland Kitchen levothyroxine (SYNTHROID) 112 MCG tablet Take 112 mcg by mouth.     . levothyroxine (SYNTHROID) 125 MCG tablet Take 125 mcg by mouth daily before breakfast.    . Magnesium Oxide 500 MG TABS Take 1 tablet by mouth daily.    . Multiple Vitamin (MULTIVITAMIN) capsule Take 1 capsule by mouth daily.    . sildenafil (REVATIO) 20 MG tablet Take 20 mg by mouth once a week.    . testosterone cypionate (DEPOTESTOSTERONE CYPIONATE) 200 MG/ML injection Inject 0.25 mLs (50 mg total) into the muscle every 14 (fourteen) days. 10 mL 0  . Vitamin D, Ergocalciferol, 50 MCG (2000 UT) CAPS Take 2 capsules by mouth daily.    . Zinc 25 MG TABS Take 1 tablet by mouth daily.    Marland Kitchen ALPRAZolam (XANAX) 0.5 MG tablet Take 1 tablet (0.5 mg total) by mouth at bedtime as needed for anxiety. 30 tablet 5  . meloxicam (MOBIC) 15 MG tablet Take 1 tablet (15 mg total) by mouth daily. 90 tablet 0  . methocarbamol (ROBAXIN) 500 MG tablet Take 1 tablet (500 mg total) by mouth 3 (three) times daily. (Patient taking differently: Take 500 mg by mouth 3 (three) times daily as needed. ) 60 tablet 2  . tamsulosin (FLOMAX) 0.4 MG CAPS capsule Take 1 capsule (0.4 mg total) by mouth daily. (Patient not taking: Reported on 05/05/2020) 30 capsule 6  . levothyroxine (SYNTHROID, LEVOTHROID) 125 MCG tablet Take 250 mcg by mouth daily before breakfast.  (Patient not taking: Reported on 05/05/2020)     No facility-administered medications prior to visit.    Review of Systems   Patient denies headache, fevers, malaise, unintentional weight loss, skin rash, eye pain, sinus congestion and sinus pain, sore throat, dysphagia,  hemoptysis , cough, dyspnea, wheezing, chest pain, palpitations, orthopnea, edema, abdominal pain, nausea, melena, diarrhea, constipation, flank pain, dysuria, hematuria, urinary  Frequency, nocturia, numbness, tingling, seizures,  Focal weakness, Loss of consciousness,  Tremor, insomnia,  depression, anxiety, and suicidal ideation.      Objective:  BP (!) 146/92 (BP Location: Left Arm, Patient Position: Sitting, Cuff Size: Large)   Temp 98.6 F (37 C) (Oral)   Resp 15   Ht 5\' 10"  (1.778 m)   Wt 216 lb 6.4 oz (98.2 kg)   BMI 31.05 kg/m   Physical Exam    Assessment & Plan:   Problem List Items Addressed This Visit      Unprioritized   Postsurgical hypothyroidism - Primary    Dose reduced from 250 mcg x 7 days to 250 mcg x 5 days,  225 mcg x 2 days as of Sept 13 by 03-11-1988 (ENT)      Relevant Medications   levothyroxine (SYNTHROID) 112 MCG tablet   levothyroxine (SYNTHROID) 125 MCG tablet   Hypogonadism in male    Managed with 0.25 ml  IM testosterone every 2 weeks. Last injection was 10 days ago        Relevant Orders   Testosterone   Hyperlipidemia   Relevant Orders   Comprehensive metabolic panel   Lipid panel   Polycythemia, secondary   Relevant Orders   CBC with Differential/Platelet  Encounter for preventive health examination   Close exposure to COVID-19 virus    He has had 4 episodes of "shedding" from vaccinated clients and prefers to avoid vaccination at this time due to concerns about the safety of the currently available vaccines.      Elevated blood-pressure reading, without diagnosis of hypertension    He has no prior history of hypertension. He will check his blood pressure several times over the next 3-4 weeks and to submit readings for evaluation. Urine microalbumin to creatinine ratio done today is pending        Other Visit Diagnoses    At increased risk of exposure to COVID-19 virus       Relevant Orders   SARS-CoV-2 Semi-Quantitative Total Antibody, Spike   Elevated blood pressure reading in office without diagnosis of hypertension       Relevant Orders   Microalbumin / creatinine urine ratio      I have changed Jalon C. Delarocha "Tommy"'s meloxicam and methocarbamol. I am also having him maintain his cetirizine,  fluticasone, sildenafil, multivitamin, Glucosamine-Chondroitin (GLUCOSAMINE CHONDR COMPLEX PO), tamsulosin, testosterone cypionate, levothyroxine, levothyroxine, Vitamin D (Ergocalciferol), Zinc, Magnesium Oxide, and ALPRAZolam.  Meds ordered this encounter  Medications  . ALPRAZolam (XANAX) 0.5 MG tablet    Sig: Take 1 tablet (0.5 mg total) by mouth at bedtime as needed for anxiety.    Dispense:  30 tablet    Refill:  5  . meloxicam (MOBIC) 7.5 MG tablet    Sig: Take 1 tablet (7.5 mg total) by mouth 2 (two) times daily as needed for pain.    Dispense:  60 tablet    Refill:  11  . methocarbamol (ROBAXIN) 500 MG tablet    Sig: Take 1 tablet (500 mg total) by mouth 3 (three) times daily as needed.    Dispense:  90 tablet    Refill:  11    Medications Discontinued During This Encounter  Medication Reason  . levothyroxine (SYNTHROID, LEVOTHROID) 125 MCG tablet Change in therapy  . ALPRAZolam (XANAX) 0.5 MG tablet Reorder  . meloxicam (MOBIC) 15 MG tablet   . methocarbamol (ROBAXIN) 500 MG tablet Reorder    Follow-up: No follow-ups on file.   Sherlene Shams, MD

## 2020-05-05 NOTE — Assessment & Plan Note (Signed)
Dose reduced from 250 mcg x 7 days to 250 mcg x 5 days,  225 mcg x 2 days as of Sept 13 by [facility] (ENT)

## 2020-05-05 NOTE — Assessment & Plan Note (Signed)
Managed with 0.25 ml  IM testosterone every 2 weeks. Last injection was 10 days ago

## 2020-05-05 NOTE — Patient Instructions (Signed)

## 2020-05-05 NOTE — Assessment & Plan Note (Signed)
He has had 4 episodes of "shedding" from vaccinated clients and prefers to avoid vaccination at this time due to concerns about the safety of the currently available vaccines.

## 2020-05-06 ENCOUNTER — Other Ambulatory Visit: Payer: Self-pay | Admitting: Internal Medicine

## 2020-05-06 DIAGNOSIS — E291 Testicular hypofunction: Secondary | ICD-10-CM

## 2020-05-06 MED ORDER — TESTOSTERONE CYPIONATE 200 MG/ML IM SOLN: 50.0000 mg | INTRAMUSCULAR | 0 refills | Status: DC

## 2020-05-10 LAB — SARS-COV-2 SEMI-QUANTITATIVE TOTAL ANTIBODY, SPIKE: SARS COV2 AB, Total Spike Semi QN: <0.4 U/mL (ref <0.8)

## 2020-09-03 ENCOUNTER — Ambulatory Visit: Payer: BC Managed Care – PPO | Admitting: Urology

## 2020-09-05 ENCOUNTER — Encounter: Payer: Self-pay | Admitting: Urology

## 2020-09-08 ENCOUNTER — Ambulatory Visit: Payer: BC Managed Care – PPO | Admitting: Urology

## 2020-09-23 ENCOUNTER — Other Ambulatory Visit: Payer: Self-pay

## 2020-09-23 ENCOUNTER — Ambulatory Visit (INDEPENDENT_AMBULATORY_CARE_PROVIDER_SITE_OTHER): Payer: BC Managed Care – PPO | Admitting: Urology

## 2020-09-23 ENCOUNTER — Encounter: Payer: Self-pay | Admitting: Urology

## 2020-09-23 VITALS — BP 141/93 | HR 83 | Ht 70.0 in | Wt 215.0 lb

## 2020-09-23 DIAGNOSIS — N138 Other obstructive and reflux uropathy: Secondary | ICD-10-CM | POA: Diagnosis not present

## 2020-09-23 DIAGNOSIS — N401 Enlarged prostate with lower urinary tract symptoms: Secondary | ICD-10-CM | POA: Diagnosis not present

## 2020-09-23 LAB — MICROSCOPIC EXAMINATION
Bacteria, UA: NONE SEEN
Epithelial Cells (non renal): NONE SEEN /hpf (ref 0–10)

## 2020-09-23 LAB — URINALYSIS, COMPLETE
Bilirubin, UA: NEGATIVE
Glucose, UA: NEGATIVE
Ketones, UA: NEGATIVE
Leukocytes,UA: NEGATIVE
Nitrite, UA: NEGATIVE
Protein,UA: NEGATIVE
RBC, UA: NEGATIVE
Specific Gravity, UA: 1.02 (ref 1.005–1.030)
Urobilinogen, Ur: 0.2 mg/dL (ref 0.2–1.0)
pH, UA: 5.5 (ref 5.0–7.5)

## 2020-09-23 LAB — BLADDER SCAN AMB NON-IMAGING: Scan Result: 0

## 2020-09-23 MED ORDER — TAMSULOSIN HCL 0.4 MG PO CAPS: 0.4000 mg | ORAL_CAPSULE | Freq: Every day | ORAL | 3 refills | Status: DC

## 2020-09-23 NOTE — Progress Notes (Signed)
09/23/2020 2:23 PM   Timothy Hines [birth year] [id number]  Referring provider: Sherlene Shams, MD [address] [place],  [place] [address]  Chief Complaint  Patient presents with  . Benign Prostatic Hypertrophy    Urologic history: 1.  History urinary retention -In conjunction with acute prostatitis January 2019  -Follow-up PVR 10/2017 233 mL -Started tamsulosin with improvement PVR  2.  Hypogonadism -On TRT managed by Dr. [name]   HPI: 55 y.o. male presents for annual follow-up.   No problems since last years visit  Remains on tamsulosin and voiding without complaints  Denies dysuria, gross hematuria  No flank, abdominal or pelvic pain  PSA 04/2020 stable 1.38  History of erectile dysfunction on sildenafil however no longer needs this medication   PMH: Past Medical History:  Diagnosis Date  . Anxiety   . GERD (gastroesophageal reflux disease)   . Thyroid disease     Surgical History: Past Surgical History:  Procedure Laterality Date  . KNEE ARTHROSCOPY Left 2013  . NOSE SURGERY    . SHOULDER SURGERY Left april 2016  . THYROID SURGERY    . TONSILLECTOMY  1989    Home Medications:  Allergies as of 09/23/2020   No Known Allergies     Medication List       Accurate as of September 23, 2020  2:23 PM. If you have any questions, ask your nurse or doctor.        ALPRAZolam 0.5 MG tablet Commonly known as: XANAX Take 1 tablet (0.5 mg total) by mouth at bedtime as needed for anxiety.   cetirizine 10 MG tablet Commonly known as: ZYRTEC Take 10 mg by mouth daily.   fluticasone 50 MCG/ACT nasal spray Commonly known as: FLONASE Place 2 sprays into both nostrils daily. What changed:   when to take this  reasons to take this   GLUCOSAMINE CHONDR COMPLEX PO Take by mouth daily.   levothyroxine 125 MCG tablet Commonly known as: SYNTHROID Take 125 mcg by mouth daily before breakfast.   levothyroxine 112 MCG  tablet Commonly known as: SYNTHROID Take 112 mcg by mouth.   Magnesium Oxide 500 MG Tabs Take 1 tablet by mouth daily.   meloxicam 7.5 MG tablet Commonly known as: MOBIC Take 1 tablet (7.5 mg total) by mouth 2 (two) times daily as needed for pain.   methocarbamol 500 MG tablet Commonly known as: ROBAXIN Take 1 tablet (500 mg total) by mouth 3 (three) times daily as needed.   multivitamin capsule Take 1 capsule by mouth daily.   QUERCETIN COMPLEX IMMUNE PO Take by mouth.   sildenafil 20 MG tablet Commonly known as: REVATIO Take 20 mg by mouth once a week.   tamsulosin 0.4 MG Caps capsule Commonly known as: FLOMAX Take 1 capsule (0.4 mg total) by mouth daily.   testosterone cypionate 200 MG/ML injection Commonly known as: DEPOTESTOSTERONE CYPIONATE Inject 0.25 mLs (50 mg total) into the muscle every 14 (fourteen) days.   Vitamin D (Ergocalciferol) 50 MCG (2000 UT) Caps Take 2 capsules by mouth daily.   Zinc 25 MG Tabs Take 1 tablet by mouth daily.       Allergies: No Known Allergies  Family History: History reviewed. No pertinent family history.  Social History:  reports that he has never smoked. He has never used smokeless tobacco. He reports current alcohol use of about 7.0 standard drinks of alcohol per week. He reports that he does not use drugs.   Physical Exam:  BP (!) 141/93   Pulse 83   Ht 5\' 10"  (1.778 m)   Wt 215 lb (97.5 kg)   BMI 30.85 kg/m   Constitutional:  Alert and oriented, No acute distress. HEENT: [place] AT, moist mucus membranes.  Trachea midline, no masses. Cardiovascular: No clubbing, cyanosis, or edema. Respiratory: Normal respiratory effort, no increased work of breathing. GU: Prostate 50 g, smooth without nodules Neurologic: Grossly intact, no focal deficits, moving all 4 extremities. Psychiatric: Normal mood and affect.   Assessment & Plan:    1. BPH with obstruction/lower urinary tract symptoms  Doing well on  tamsulosin  Refill sent to pharmacy  PVR by bladder scan 0 mL  Continue annual follow-up   , MD  [facility] [address], [address] [place], [place] [phone]

## 2021-02-17 ENCOUNTER — Other Ambulatory Visit: Payer: Self-pay | Admitting: Internal Medicine

## 2021-02-18 NOTE — Telephone Encounter (Signed)
Denied.  Has been 10 months since last appt.

## 2021-02-24 NOTE — Telephone Encounter (Signed)
LMTCB. Need to schedule pt for a follow up in order to get any more refills.

## 2021-02-25 NOTE — Telephone Encounter (Signed)
Appt has been made for 04/14/2021.

## 2021-04-14 ENCOUNTER — Encounter: Payer: Self-pay | Admitting: Internal Medicine

## 2021-04-14 ENCOUNTER — Other Ambulatory Visit: Payer: Self-pay

## 2021-04-14 ENCOUNTER — Ambulatory Visit: Payer: BC Managed Care – PPO | Admitting: Internal Medicine

## 2021-04-14 VITALS — BP 124/84 | HR 67 | Temp 96.2°F | Ht 70.0 in | Wt 218.4 lb

## 2021-04-14 DIAGNOSIS — R5383 Other fatigue: Secondary | ICD-10-CM | POA: Diagnosis not present

## 2021-04-14 DIAGNOSIS — D751 Secondary polycythemia: Secondary | ICD-10-CM

## 2021-04-14 DIAGNOSIS — N401 Enlarged prostate with lower urinary tract symptoms: Secondary | ICD-10-CM

## 2021-04-14 DIAGNOSIS — E890 Postprocedural hypothyroidism: Secondary | ICD-10-CM

## 2021-04-14 DIAGNOSIS — R030 Elevated blood-pressure reading, without diagnosis of hypertension: Secondary | ICD-10-CM

## 2021-04-14 DIAGNOSIS — E7800 Pure hypercholesterolemia, unspecified: Secondary | ICD-10-CM

## 2021-04-14 DIAGNOSIS — E559 Vitamin D deficiency, unspecified: Secondary | ICD-10-CM | POA: Diagnosis not present

## 2021-04-14 DIAGNOSIS — N138 Other obstructive and reflux uropathy: Secondary | ICD-10-CM

## 2021-04-14 DIAGNOSIS — J3089 Other allergic rhinitis: Secondary | ICD-10-CM

## 2021-04-14 DIAGNOSIS — E291 Testicular hypofunction: Secondary | ICD-10-CM

## 2021-04-14 DIAGNOSIS — Z8616 Personal history of COVID-19: Secondary | ICD-10-CM

## 2021-04-14 LAB — CBC WITH DIFFERENTIAL/PLATELET
Basophils Absolute: 0.1 10*3/uL (ref 0.0–0.1)
Basophils Relative: 1.9 % (ref 0.0–3.0)
Eosinophils Absolute: 0 10*3/uL (ref 0.0–0.7)
Eosinophils Relative: 1 % (ref 0.0–5.0)
HCT: 44.7 % (ref 39.0–52.0)
Hemoglobin: 15.4 g/dL (ref 13.0–17.0)
Lymphocytes Relative: 26 % (ref 12.0–46.0)
Lymphs Abs: 0.8 10*3/uL (ref 0.7–4.0)
MCHC: 34.6 g/dL (ref 30.0–36.0)
MCV: 89.4 fl (ref 78.0–100.0)
Monocytes Absolute: 0.2 10*3/uL (ref 0.1–1.0)
Monocytes Relative: 7.1 % (ref 3.0–12.0)
Neutro Abs: 2.1 10*3/uL (ref 1.4–7.7)
Neutrophils Relative %: 64 % (ref 43.0–77.0)
Platelets: 278 10*3/uL (ref 150.0–400.0)
RBC: 4.99 Mil/uL (ref 4.22–5.81)
RDW: 13.3 % (ref 11.5–15.5)
WBC: 3.2 10*3/uL — ABNORMAL LOW (ref 4.0–10.5)

## 2021-04-14 LAB — MICROALBUMIN / CREATININE URINE RATIO
Creatinine,U: 23 mg/dL
Microalb Creat Ratio: 3 mg/g (ref 0.0–30.0)
Microalb, Ur: <0.7 mg/dL (ref 0.0–1.9)

## 2021-04-14 LAB — LIPID PANEL
Cholesterol: 235 mg/dL — ABNORMAL HIGH (ref 0–200)
HDL: 52.2 mg/dL (ref >39.00)
LDL Cholesterol: 169 mg/dL — ABNORMAL HIGH (ref 0–99)
NonHDL: 182.44
Total CHOL/HDL Ratio: 4
Triglycerides: 67 mg/dL (ref 0.0–149.0)
VLDL: 13.4 mg/dL (ref 0.0–40.0)

## 2021-04-14 LAB — COMPREHENSIVE METABOLIC PANEL
ALT: 24 U/L (ref 0–53)
AST: 27 U/L (ref 0–37)
Albumin: 4.9 g/dL (ref 3.5–5.2)
Alkaline Phosphatase: 47 U/L (ref 39–117)
BUN: 20 mg/dL (ref 6–23)
CO2: 25 mEq/L (ref 19–32)
Calcium: 9.7 mg/dL (ref 8.4–10.5)
Chloride: 103 mEq/L (ref 96–112)
Creatinine, Ser: 1.09 mg/dL (ref 0.40–1.50)
GFR: 76.64 mL/min (ref >60.00)
Glucose, Bld: 83 mg/dL (ref 70–99)
Potassium: 4.3 mEq/L (ref 3.5–5.1)
Sodium: 138 mEq/L (ref 135–145)
Total Bilirubin: 1.3 mg/dL — ABNORMAL HIGH (ref 0.2–1.2)
Total Protein: 7.4 g/dL (ref 6.0–8.3)

## 2021-04-14 LAB — PSA: PSA: 1.16 ng/mL (ref 0.10–4.00)

## 2021-04-14 LAB — VITAMIN D 25 HYDROXY (VIT D DEFICIENCY, FRACTURES): VITD: 75.88 ng/mL (ref 30.00–100.00)

## 2021-04-14 LAB — VITAMIN B12: Vitamin B-12: 398 pg/mL (ref 211–911)

## 2021-04-14 LAB — HEMOGLOBIN A1C: Hgb A1c MFr Bld: 5.7 % (ref 4.6–6.5)

## 2021-04-14 NOTE — Assessment & Plan Note (Addendum)
Has not donated blood in one year,  hgb is normal   Lab Results  Component Value Date   WBC 3.2 (L) 04/14/2021   HGB 15.4 04/14/2021   HCT 44.7 04/14/2021   MCV 89.4 04/14/2021   PLT 278.0 04/14/2021

## 2021-04-14 NOTE — Assessment & Plan Note (Signed)
Using flonase.  

## 2021-04-14 NOTE — Assessment & Plan Note (Addendum)
As an unvaccinated patient by choice, he had a mild case in early January after having contact on New Year's Eve with an asymptomatic but infected nephew.  Infection presented with back pain , headache.  symptoms improved after 24 hours with ivermectin treated by Dr Mayford Knife .  Did not use the budesonide .  Antibody level is pending

## 2021-04-14 NOTE — Assessment & Plan Note (Signed)
No longer needing flomax

## 2021-04-14 NOTE — Progress Notes (Signed)
Subjective:  Patient ID: Timothy Hines, male    DOB: 07-25-1966  Age: 55 y.o. MRN: [id number]  CC: The primary encounter diagnosis was Hypogonadism in male. Diagnoses of Pure hypercholesterolemia, Elevated blood-pressure reading, without diagnosis of hypertension, Fatigue, unspecified type, Vitamin D deficiency, History of COVID-19, BPH with obstruction/lower urinary tract symptoms, Environmental and seasonal allergies, Polycythemia, secondary, Postsurgical hypothyroidism, and Vitamin D insufficiency were also pertinent to this visit.  HPI Gerron Guidotti Gracie presents for follow up on chronic issues including  hypotestosteronism and hypothyroidism.  This visit occurred during the SARS-CoV-2 public health emergency.  Safety protocols were in place, including screening questions prior to the visit, additional usage of staff PPE, and extensive cleaning of exam room while observing appropriate contact time as indicated for disinfecting solutions.    He and family are doing well.  His son  Vicente Serene is turning 16 . 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  They have had his mother in law move in with them due to  early neurologic changes since her diagnosis of Parkinson's disease     Hypothyroidism:  he is non a very high dose of medication ;  his dose managed by [facility] with an alternating regimen of 225 mcg alternating 237 mcg    Vitamin D deficiency:  he has been taking 10,000 Ius of D3 daily for one year  .  He chose this dose for himself after recovering from COVID infection and has not had his level checked.   Hypogonadism :  Taking 50 mcg testosterone every 14 days .  No overt symptoms of untreated low testosterone currently except fatigue and relative weaknesss compared to his strength 10 years ago        Outpatient Medications Prior to Visit  Medication Sig Dispense Refill   ALPRAZolam (XANAX) 0.5 MG tablet Take 1 tablet (0.5 mg total) by mouth at bedtime as needed for anxiety. 30 tablet 5   Bioflavonoid Products (QUERCETIN COMPLEX IMMUNE PO) Take by mouth.     cetirizine (ZYRTEC) 10 MG tablet Take 10 mg by mouth daily.     fluticasone (FLONASE) 50 MCG/ACT nasal spray Place 2 sprays into both nostrils daily. (Patient taking differently: Place 2 sprays into both nostrils as needed.) 16 g 6   Glucosamine-Chondroitin (GLUCOSAMINE CHONDR COMPLEX PO) Take by mouth daily.     levothyroxine (SYNTHROID) 100 MCG tablet Take 100 mcg by mouth every other day.     levothyroxine (SYNTHROID) 112 MCG tablet Take 112 mcg by mouth every other day.     levothyroxine (SYNTHROID) 125 MCG tablet Take 125 mcg by mouth daily before breakfast.     Magnesium Oxide 500 MG TABS Take 1 tablet by mouth daily.     meloxicam (MOBIC) 7.5 MG tablet Take 1 tablet (7.5 mg total) by mouth 2 (two) times daily as needed for pain. 60 tablet 11   methocarbamol (ROBAXIN) 500 MG tablet Take 1 tablet (500 mg total) by mouth 3 (three) times daily as needed. 90 tablet 11   Multiple Vitamin (MULTIVITAMIN) capsule Take 1 capsule by mouth daily.     testosterone cypionate (DEPOTESTOSTERONE CYPIONATE) 200 MG/ML injection Inject 0.25 mLs (50 mg total) into the muscle every 14 (fourteen) days. 10 mL 0   TURMERIC PO Take 1 capsule by mouth daily.  Vitamin D, Ergocalciferol, 50 MCG (2000 UT) CAPS Take 2 capsules by mouth daily.     Zinc 25 MG TABS Take 1 tablet by mouth daily.     sildenafil (REVATIO) 20 MG tablet Take 20 mg by mouth once a week. (Patient not taking: Reported on 04/14/2021)     tamsulosin (FLOMAX) 0.4 MG CAPS capsule Take 1 capsule (0.4 mg total) by mouth daily. (Patient not taking: Reported on 04/14/2021) 90 capsule 3   No facility-administered medications prior to visit.    Review of Systems;  Patient denies  headache, fevers, malaise, unintentional weight loss, skin rash, eye pain, sinus congestion and sinus pain, sore throat, dysphagia,  hemoptysis , cough, dyspnea, wheezing, chest pain, palpitations, orthopnea, edema, abdominal pain, nausea, melena, diarrhea, constipation, flank pain, dysuria, hematuria, urinary  Frequency, nocturia, numbness, tingling, seizures,  Focal weakness, Loss of consciousness,  Tremor, insomnia, depression, anxiety, and suicidal ideation.      Objective:  BP 124/84 (BP Location: Left Arm, Patient Position: Sitting, Cuff Size: Large)   Pulse 67   Temp (!) 96.2 F (35.7 C) (Temporal)   Ht 5\' 10"  (1.778 m)   Wt 218 lb 6.4 oz (99.1 kg)   SpO2 98%   BMI 31.34 kg/m   BP Readings from Last 3 Encounters:  04/14/21 124/84  09/23/20 (!) 141/93  05/05/20 (!) 146/92    Wt Readings from Last 3 Encounters:  04/14/21 218 lb 6.4 oz (99.1 kg)  09/23/20 215 lb (97.5 kg)  05/05/20 216 lb 6.4 oz (98.2 kg)    General appearance: alert, cooperative and appears stated age Ears: normal TM's and external ear canals both ears Throat: lips, mucosa, and tongue normal; teeth and gums normal Neck: no adenopathy, no carotid bruit, supple, symmetrical, trachea midline and thyroid not enlarged, symmetric, no tenderness/mass/nodules Back: symmetric, no curvature. ROM normal. No CVA tenderness. Lungs: clear to auscultation bilaterally Heart: regular rate and rhythm, S1, S2 normal, no murmur, click, rub or gallop Abdomen: soft, non-tender; bowel sounds normal; no masses,  no organomegaly Pulses: 2+ and symmetric Skin: Skin color, texture, turgor normal. No rashes or lesions Lymph nodes: Cervical, supraclavicular, and axillary nodes normal.  Lab Results  Component Value Date   HGBA1C 5.7 04/14/2021    Lab Results  Component Value Date   CREATININE 1.09 04/14/2021   CREATININE 1.08 05/05/2020   CREATININE 0.98 04/11/2019    Lab Results  Component Value Date   WBC 3.2 (L)  04/14/2021   HGB 15.4 04/14/2021   HCT 44.7 04/14/2021   PLT 278.0 04/14/2021   GLUCOSE 83 04/14/2021   CHOL 235 (H) 04/14/2021   TRIG 67.0 04/14/2021   HDL 52.20 04/14/2021   LDLCALC 169 (H) 04/14/2021   ALT 24 04/14/2021   AST 27 04/14/2021   NA 138 04/14/2021   K 4.3 04/14/2021   CL 103 04/14/2021   CREATININE 1.09 04/14/2021   BUN 20 04/14/2021   CO2 25 04/14/2021   TSH 0.30 (L) 04/11/2019   PSA 1.16 04/14/2021   HGBA1C 5.7 04/14/2021   MICROALBUR <0.7 04/14/2021    DG Chest 2 View  Result Date: 12/16/2017 CLINICAL DATA:  Productive cough for 3 weeks. EXAM: CHEST - 2 VIEW COMPARISON:  06/08/2014 FINDINGS: The heart size and mediastinal contours are within normal limits. Both lungs are clear. Stable mild thoracic and lumbar spine degenerative disc disease. IMPRESSION: Stable exam.  No active cardiopulmonary disease. Electronically Signed   By: 06/10/2014 M.D.   On: 12/16/2017 12:08  Assessment & Plan:   Problem List Items Addressed This Visit       Unprioritized   BPH with obstruction/lower urinary tract symptoms    No longer needing flomax       Elevated blood-pressure reading, without diagnosis of hypertension    Today's reading is normal.  Microalbumin/cr ratio is normal.  Advised to periodically check readings at home or office   Lab Results  Component Value Date   LABMICR See below: 09/23/2020   MICROALBUR <0.7 04/14/2021   MICROALBUR <0.7 05/05/2020           Relevant Orders   Microalbumin / creatinine urine ratio (Completed)   Environmental and seasonal allergies    Using flonase      History of COVID-19    As an unvaccinated patient by choice, he had a mild case in early January after having contact on New Year's Eve with an asymptomatic but infected nephew.  Infection presented with back pain , headache.  symptoms improved after 24 hours with ivermectin treated by Dr Mayford Knife .  Did not use the budesonide .  Antibody level is pending        Relevant Orders   SARS-CoV-2 Semi-Quantitative Total Antibody, Spike   Hyperlipidemia    10 yr risk using FRC is 6% .   No treatment indicated.   Lab Results  Component Value Date   CHOL 235 (H) 04/14/2021   HDL 52.20 04/14/2021   LDLCALC 169 (H) 04/14/2021   TRIG 67.0 04/14/2021   CHOLHDL 4 04/14/2021        Relevant Orders   Lipid panel (Completed)   Hemoglobin A1c (Completed)   Hypogonadism in male - Primary    Testosterone level is low on 50 mcg every 14 days,  Will confirm when last dose was taken relative to testing and increase to 100 mcg if indicated.       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  Managed by Dr Elenore Rota with suppressive doses       Relevant Medications   levothyroxine (SYNTHROID) 100 MCG tablet   Vitamin D insufficiency    Resolved with 10,000 IUs daily.  Given his jump in level , will recommend dose reduction to 5000 iUs daily       Other Visit Diagnoses     Fatigue, unspecified type       Relevant Orders   Vitamin B12 (Completed)   CBC with Differential/Platelet (Completed)   Vitamin D deficiency       Relevant Orders   VITAMIN D 25 Hydroxy (Vit-D Deficiency, Fractures) (Completed)      I spent 40 minutes dedicated to the care of this patient on the date of this encounter to include pre-visit review of his medical history,  Face-to-face time with the patient , counselling on use of testosterone,  and post visit ordering of testing and therapeutics.   Medications Discontinued During This Encounter  Medication Reason    tamsulosin (FLOMAX) 0.4 MG CAPS capsule  Follow-up: No follow-ups on file.   [name], MD

## 2021-04-15 LAB — TESTOSTERONE TOTAL,FREE,BIO, MALES
Albumin: 5 g/dL (ref 3.6–5.1)
Sex Hormone Binding: 13 nmol/L (ref 10–50)
Testosterone: 82 ng/dL — ABNORMAL LOW (ref 250–827)

## 2021-04-16 DIAGNOSIS — E559 Vitamin D deficiency, unspecified: Secondary | ICD-10-CM | POA: Insufficient documentation

## 2021-04-16 DIAGNOSIS — E291 Testicular hypofunction: Secondary | ICD-10-CM

## 2021-04-16 NOTE — Assessment & Plan Note (Signed)
Resolved with 10,000 IUs daily.  Given his jump in level , will recommend dose reduction to 5000 iUs daily

## 2021-04-16 NOTE — Assessment & Plan Note (Signed)
Today's reading is normal.  Microalbumin/cr ratio is normal.  Advised to periodically check readings at home or office   Lab Results  Component Value Date   LABMICR See below: 09/23/2020   MICROALBUR <0.7 04/14/2021   MICROALBUR <0.7 05/05/2020

## 2021-04-16 NOTE — Assessment & Plan Note (Signed)
Testosterone level is low on 50 mcg every 14 days,  Will confirm when last dose was taken relative to testing and increase to 100 mcg if indicated.

## 2021-04-16 NOTE — Assessment & Plan Note (Signed)
10 yr risk using FRC is 6% .   No treatment indicated.   Lab Results  Component Value Date   CHOL 235 (H) 04/14/2021   HDL 52.20 04/14/2021   LDLCALC 169 (H) 04/14/2021   TRIG 67.0 04/14/2021   CHOLHDL 4 04/14/2021

## 2021-04-16 NOTE — Assessment & Plan Note (Addendum)
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  Managed by Dr Elenore Rota with suppressive doses

## 2021-04-18 MED ORDER — TESTOSTERONE CYPIONATE 200 MG/ML IM SOLN: 100.0000 mg | INTRAMUSCULAR | 0 refills | Status: DC

## 2021-04-19 LAB — SARS-COV-2 SEMI-QUANTITATIVE TOTAL ANTIBODY, SPIKE: SARS COV2 AB, Total Spike Semi QN: 3.1 U/mL — ABNORMAL HIGH (ref <0.8)

## 2021-09-23 ENCOUNTER — Ambulatory Visit: Payer: Self-pay | Admitting: Urology

## 2021-10-07 ENCOUNTER — Other Ambulatory Visit: Payer: Self-pay

## 2021-10-07 ENCOUNTER — Ambulatory Visit: Payer: BC Managed Care – PPO | Admitting: Urology

## 2021-10-07 ENCOUNTER — Encounter: Payer: Self-pay | Admitting: Urology

## 2021-10-07 VITALS — BP 143/84 | HR 97 | Ht 70.0 in | Wt 215.0 lb

## 2021-10-07 DIAGNOSIS — N401 Enlarged prostate with lower urinary tract symptoms: Secondary | ICD-10-CM

## 2021-10-07 DIAGNOSIS — N138 Other obstructive and reflux uropathy: Secondary | ICD-10-CM

## 2021-10-07 LAB — BLADDER SCAN AMB NON-IMAGING: Scan Result: 40

## 2021-10-07 MED ORDER — TAMSULOSIN HCL 0.4 MG PO CAPS: 0.4000 mg | ORAL_CAPSULE | Freq: Every day | ORAL | 3 refills | Status: DC

## 2021-10-07 NOTE — Progress Notes (Signed)
10/07/2021 2:31 PM   Timothy Hines 06/25/1966 [id number]  Referring provider: [name], MD [place],  [place] [address]  Chief Complaint  Patient presents with   Benign Prostatic Hypertrophy    Urologic history: 1.  History urinary retention -In conjunction with acute prostatitis January 2019  -Follow-up PVR 10/2017 233 mL -Started tamsulosin with improvement PVR   2.  Hypogonadism -On TRT managed by [name]    HPI: 56 y.o. male presents for annual follow-up.  No problems since last years visit He discontinued his tamsulosin but after stopping did note urinary hesitancy and sensation of incomplete emptying which resolved after restarting Denies dysuria, gross hematuria No flank, abdominal or pelvic pain PSA 03/2021 stable 1.16  PMH: Past Medical History:  Diagnosis Date   Anxiety    GERD (gastroesophageal reflux disease)    Thyroid disease     Surgical History: Past Surgical History:  Procedure Laterality Date   KNEE ARTHROSCOPY Left 2013   NOSE SURGERY     SHOULDER SURGERY Left april 2016   THYROID SURGERY     TONSILLECTOMY  1989    Home Medications:  Allergies as of 10/07/2021   No Known Allergies      Medication List        Accurate as of October 07, 2021  2:31 PM. If you have any questions, ask your nurse or doctor.          ALPRAZolam 0.5 MG tablet Commonly known as: XANAX Take 1 tablet (0.5 mg total) by mouth at bedtime as needed for anxiety.   cetirizine 10 MG tablet Commonly known as: ZYRTEC Take 10 mg by mouth daily.   fluticasone 50 MCG/ACT nasal spray Commonly known as: FLONASE Place 2 sprays into both nostrils daily. What changed:  when to take this reasons to take this   [place] Take by mouth daily.   levothyroxine 125 MCG tablet Commonly known as: SYNTHROID Take 125 mcg by mouth daily before breakfast.   levothyroxine 112 MCG tablet Commonly known  as: SYNTHROID Take 112 mcg by mouth every other day.   levothyroxine 100 MCG tablet Commonly known as: SYNTHROID Take 100 mcg by mouth every other day.   Magnesium Oxide 500 MG Tabs Take 1 tablet by mouth daily.   meloxicam 7.5 MG tablet Commonly known as: MOBIC Take 1 tablet (7.5 mg total) by mouth 2 (two) times daily as needed for pain.   methocarbamol 500 MG tablet Commonly known as: ROBAXIN Take 1 tablet (500 mg total) by mouth 3 (three) times daily as needed.   multivitamin capsule Take 1 capsule by mouth daily.   QUERCETIN COMPLEX IMMUNE PO Take by mouth.   sildenafil 20 MG tablet Commonly known as: REVATIO Take 20 mg by mouth once a week.   testosterone cypionate 200 MG/ML injection Commonly known as: DEPOTESTOSTERONE CYPIONATE Inject 0.5 mLs (100 mg total) into the muscle every 14 (fourteen) days.   TURMERIC PO Take 1 capsule by mouth daily.   Vitamin D (Ergocalciferol) 50 MCG (2000 UT) Caps Take 2 capsules by mouth daily.   Zinc 25 MG Tabs Take 1 tablet by mouth daily.        Allergies: No Known Allergies  Family History: History reviewed. No pertinent family history.  Social History:  reports that he has never smoked. He has never used smokeless tobacco. He reports current alcohol use of about 7.0 standard drinks per week. He reports that he  does not use drugs.   Physical Exam: BP (!) 143/84    Pulse 97    Ht 5\' 10"  (1.778 m)    Wt 215 lb (97.5 kg)    BMI 30.85 kg/m   Constitutional:  Alert and oriented, No acute distress. HEENT: [place] AT, moist mucus membranes.  Trachea midline, no masses. Cardiovascular: No clubbing, cyanosis, or edema. Respiratory: Normal respiratory effort, no increased work of breathing. GU: Declined DRE today Neurologic: Grossly intact, no focal deficits, moving all 4 extremities. Psychiatric: Normal mood and affect.   Assessment & Plan:    1. BPH with obstruction/lower urinary tract symptoms Doing well on  tamsulosin Refill sent to pharmacy PVR by bladder scan 40 mL Continue annual follow-up   Abbie Sons, MD  [facility] [address], [place],  [address] [phone]

## 2021-10-17 ENCOUNTER — Other Ambulatory Visit: Payer: Self-pay | Admitting: Internal Medicine

## 2022-09-25 ENCOUNTER — Telehealth: Payer: Self-pay

## 2022-09-25 DIAGNOSIS — Z1211 Encounter for screening for malignant neoplasm of colon: Secondary | ICD-10-CM

## 2022-09-25 NOTE — Telephone Encounter (Signed)
Pt called and I read the message to him and he stated he would like for the provider to go ahead and order it

## 2022-09-25 NOTE — Telephone Encounter (Signed)
LMTCB. Need to let pt know that he is overdue for his cologuard, colon cancer screening, and see if he would like for [place] to go ahead and order.

## 2022-09-25 NOTE — Telephone Encounter (Signed)
Ordered

## 2022-09-25 NOTE — Addendum Note (Signed)
Addended by: Adair Laundry on: 09/25/2022 02:13 PM   Modules accepted: Orders

## 2022-09-29 ENCOUNTER — Ambulatory Visit: Payer: BC Managed Care – PPO | Admitting: Internal Medicine

## 2022-09-29 ENCOUNTER — Encounter: Payer: Self-pay | Admitting: Internal Medicine

## 2022-09-29 VITALS — BP 124/86 | HR 91 | Temp 97.9°F | Ht 70.0 in | Wt 214.8 lb

## 2022-09-29 DIAGNOSIS — Z0001 Encounter for general adult medical examination with abnormal findings: Secondary | ICD-10-CM | POA: Diagnosis not present

## 2022-09-29 DIAGNOSIS — Z114 Encounter for screening for human immunodeficiency virus [HIV]: Secondary | ICD-10-CM

## 2022-09-29 DIAGNOSIS — E890 Postprocedural hypothyroidism: Secondary | ICD-10-CM

## 2022-09-29 DIAGNOSIS — E7800 Pure hypercholesterolemia, unspecified: Secondary | ICD-10-CM | POA: Diagnosis not present

## 2022-09-29 DIAGNOSIS — R030 Elevated blood-pressure reading, without diagnosis of hypertension: Secondary | ICD-10-CM | POA: Diagnosis not present

## 2022-09-29 DIAGNOSIS — Z23 Encounter for immunization: Secondary | ICD-10-CM

## 2022-09-29 DIAGNOSIS — Z125 Encounter for screening for malignant neoplasm of prostate: Secondary | ICD-10-CM

## 2022-09-29 DIAGNOSIS — Z1159 Encounter for screening for other viral diseases: Secondary | ICD-10-CM | POA: Diagnosis not present

## 2022-09-29 DIAGNOSIS — D751 Secondary polycythemia: Secondary | ICD-10-CM

## 2022-09-29 DIAGNOSIS — Z0000 Encounter for general adult medical examination without abnormal findings: Secondary | ICD-10-CM

## 2022-09-29 DIAGNOSIS — R5383 Other fatigue: Secondary | ICD-10-CM | POA: Diagnosis not present

## 2022-09-29 DIAGNOSIS — E291 Testicular hypofunction: Secondary | ICD-10-CM

## 2022-09-29 MED ORDER — METHOCARBAMOL 500 MG PO TABS: 500.0000 mg | ORAL_TABLET | Freq: Three times a day (TID) | ORAL | 0 refills | Status: AC | PRN

## 2022-09-29 MED ORDER — MELOXICAM 7.5 MG PO TABS: 7.5000 mg | ORAL_TABLET | Freq: Two times a day (BID) | ORAL | 11 refills | Status: AC | PRN

## 2022-09-29 MED ORDER — ALPRAZOLAM 0.5 MG PO TABS: 0.5000 mg | ORAL_TABLET | Freq: Every evening | ORAL | 0 refills | Status: DC | PRN

## 2022-09-29 NOTE — Progress Notes (Unsigned)
Patient ID: Timothy Hines, male    DOB: 01-Aug-1966  Age: 57 y.o. MRN: [id number]  The patient is here for annual preventive examination and management of other chronic and acute problems. Last seen August 2022    The risk factors are reflected in the social history.   The roster of all physicians providing medical care to patient - is listed in the Snapshot section of the chart.   Activities of daily living:  The patient is 100% independent in all ADLs: dressing, toileting, feeding as well as independent mobility   Home safety : The patient has smoke detectors in the home. They wear seatbelts.  There are no unsecured firearms at home. There is no violence in the home.    There is no risks for hepatitis, STDs or HIV. There is no   history of blood transfusion. They have no travel history to infectious disease endemic areas of the world.   The patient has seen their dentist in the last six month. They have seen their eye doctor in the last year. The patinet  denies slight hearing difficulty with regard to whispered voices and some television programs.  They have deferred audiologic testing in the last year.  They do not  have excessive sun exposure. Discussed the need for sun protection: hats, long sleeves and use of sunscreen if there is significant sun exposure.    Diet: the importance of a healthy diet is discussed. They do have a healthy diet.   The benefits of regular aerobic exercise were discussed. The patient  exercises  3 to 5 days per week  for  60 minutes.    Depression screen: there are no signs or vegative symptoms of depression- irritability, change in appetite, anhedonia, sadness/tearfullness.   The following portions of the patient's history were reviewed and updated as appropriate: allergies, current medications, past family history, past medical history,  past surgical history, past social history  and problem list.   Visual acuity was not assessed per patient  preference since the patient has regular follow up with an  ophthalmologist. Hearing and body mass index were assessed and reviewed.    During the course of the visit the patient was educated and counseled about appropriate screening and preventive services including : fall prevention , diabetes screening, nutrition counseling, colorectal cancer screening, and recommended immunizations.    Chief Complaint   1) recent prednisone prescribed by [facility] for sensorineural hearing loss 30 mg qd x 2 , then a taper for a total fo 10 days . Hearing test done at evaluation , was compared to 2004 and improved in the right ear and no different in the left.  May have been triggered by a high salt dinner hte night before.   2) testosterone   last use in 2022 0.5 ml qo week was using intermittently.  Stopped it in July 2023,  left knee arthrsocopy,  has resumed one month ago and is halfway between shots   3) left medial knee arthroscopy boen and cartilage removed . Considering Synisc vs PRP  Review of Symptoms  Patient denies headache, fevers, malaise, unintentional weight loss, skin rash, eye pain, sinus congestion and sinus pain, sore throat, dysphagia,  hemoptysis , cough, dyspnea, wheezing, chest pain, palpitations, orthopnea, edema, abdominal pain, nausea, melena, diarrhea, constipation, flank pain, dysuria, hematuria, urinary  Frequency, nocturia, numbness, tingling, seizures,  Focal weakness, Loss of consciousness,  Tremor, insomnia, depression, anxiety, and suicidal ideation.    Physical Exam:  BP 124/86  Pulse 91   Temp 97.9 F (36.6 C) (Oral)   Ht 5' 10"$  (1.778 m)   Wt 214 lb 12.8 oz (97.4 kg)   SpO2 96%   BMI 30.82 kg/m    Physical Exam  Assessment and Plan: Pure hypercholesterolemia  Elevated blood-pressure reading, without diagnosis of hypertension  Fatigue, unspecified type  Encounter for screening for HIV  Need for hepatitis C screening test  Hypogonadism in male    No  follow-ups on file.  [name], MD

## 2022-09-29 NOTE — Assessment & Plan Note (Signed)
Managed intermittently with parenteral injections.  He resumed biweekly injections  (0.5 ml) 1 month ago and is due for mid point testosterone level .  Reminded that CBC and lipids will need to be followed every 3 months

## 2022-09-29 NOTE — Patient Instructions (Signed)
Good to see you!  Tell Stoioff that I did a PSA today  Get your eyes examined : you're over 50 and due   Cologuard has been ordered

## 2022-09-30 ENCOUNTER — Encounter: Payer: Self-pay | Admitting: Internal Medicine

## 2022-09-30 LAB — COMPLETE METABOLIC PANEL WITH GFR
AG Ratio: 2.3 (calc) (ref 1.0–2.5)
ALT: 18 U/L (ref 9–46)
AST: 21 U/L (ref 10–35)
Albumin: 4.9 g/dL (ref 3.6–5.1)
Alkaline phosphatase (APISO): 39 U/L (ref 35–144)
BUN: 16 mg/dL (ref 7–25)
CO2: 21 mmol/L (ref 20–32)
Calcium: 9.4 mg/dL (ref 8.6–10.3)
Chloride: 104 mmol/L (ref 98–110)
Creat: 0.92 mg/dL (ref 0.70–1.30)
Globulin: 2.1 g/dL (calc) (ref 1.9–3.7)
Glucose, Bld: 89 mg/dL (ref 65–99)
Potassium: 4.1 mmol/L (ref 3.5–5.3)
Sodium: 139 mmol/L (ref 135–146)
Total Bilirubin: 0.9 mg/dL (ref 0.2–1.2)
Total Protein: 7 g/dL (ref 6.1–8.1)
eGFR: 98 mL/min/{1.73_m2} (ref >=60)

## 2022-09-30 LAB — TESTOSTERONE TOTAL,FREE,BIO, MALES
Albumin: 4.9 g/dL (ref 3.6–5.1)
Sex Hormone Binding: 8 nmol/L — ABNORMAL LOW (ref 22–77)
Testosterone, Bioavailable: 243.3 ng/dL (ref 110.0–575.0)
Testosterone, Free: 109.1 pg/mL (ref 46.0–224.0)
Testosterone: 370 ng/dL (ref 250–827)

## 2022-10-01 LAB — CBC WITH DIFFERENTIAL/PLATELET
Absolute Monocytes: 213 cells/uL (ref 200–950)
Basophils Absolute: 49 cells/uL (ref 0–200)
Basophils Relative: 1.2 %
Eosinophils Absolute: 41 cells/uL (ref 15–500)
Eosinophils Relative: 1 %
HCT: 45 % (ref 38.5–50.0)
Hemoglobin: 15.8 g/dL (ref 13.2–17.1)
Lymphs Abs: 1189 cells/uL (ref 850–3900)
MCH: 31.4 pg (ref 27.0–33.0)
MCHC: 35.1 g/dL (ref 32.0–36.0)
MCV: 89.5 fL (ref 80.0–100.0)
MPV: 9.8 fL (ref 7.5–12.5)
Monocytes Relative: 5.2 %
Neutro Abs: 2608 cells/uL (ref 1500–7800)
Neutrophils Relative %: 63.6 %
Platelets: 272 10*3/uL (ref 140–400)
RBC: 5.03 10*6/uL (ref 4.20–5.80)
RDW: 12.3 % (ref 11.0–15.0)
Total Lymphocyte: 29 %
WBC: 4.1 10*3/uL (ref 3.8–10.8)

## 2022-10-01 LAB — HEMOGLOBIN A1C
Hgb A1c MFr Bld: 6 % of total Hgb — ABNORMAL HIGH (ref <5.7)
Mean Plasma Glucose: 126 mg/dL
eAG (mmol/L): 7 mmol/L

## 2022-10-01 LAB — LIPID PANEL
Cholesterol: 226 mg/dL — ABNORMAL HIGH (ref <200)
HDL: 63 mg/dL (ref >=40)
LDL Cholesterol (Calc): 148 mg/dL (calc) — ABNORMAL HIGH
Non-HDL Cholesterol (Calc): 163 mg/dL (calc) — ABNORMAL HIGH (ref <130)
Total CHOL/HDL Ratio: 3.6 (calc) (ref <5.0)
Triglycerides: 59 mg/dL (ref <150)

## 2022-10-01 LAB — PSA: PSA: 1.56 ng/mL (ref <=4.00)

## 2022-10-01 LAB — LDL CHOLESTEROL, DIRECT: Direct LDL: 163 mg/dL — ABNORMAL HIGH (ref <100)

## 2022-10-01 LAB — HIV ANTIBODY (ROUTINE TESTING W REFLEX): HIV 1&2 Ab, 4th Generation: NONREACTIVE

## 2022-10-01 LAB — HEPATITIS C ANTIBODY: Hepatitis C Ab: NONREACTIVE

## 2022-10-01 LAB — TSH: TSH: 3.17 mIU/L (ref 0.40–4.50)

## 2022-10-01 NOTE — Assessment & Plan Note (Signed)
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  Managed by Dr Kathyrn Sheriff with suppressive doses of levothyroxine  Lab Results  Component Value Date   TSH 3.17 09/29/2022

## 2022-10-01 NOTE — Assessment & Plan Note (Signed)
10 yr risk using FRC remains  6% .   No treatment is indicated.   Lab Results  Component Value Date   CHOL 226 (H) 09/29/2022   HDL 63 09/29/2022   LDLCALC 148 (H) 09/29/2022   LDLDIRECT 163 (H) 09/29/2022   TRIG 59 09/29/2022   CHOLHDL 3.6 09/29/2022

## 2022-10-01 NOTE — Assessment & Plan Note (Signed)

## 2022-10-01 NOTE — Assessment & Plan Note (Signed)
Has not donated blood in one year,  hgb is normal   Lab Results  Component Value Date   WBC 4.1 09/29/2022   HGB 15.8 09/29/2022   HCT 45.0 09/29/2022   MCV 89.5 09/29/2022   PLT 272 09/29/2022

## 2022-10-03 ENCOUNTER — Encounter: Payer: Self-pay | Admitting: Internal Medicine

## 2022-10-03 ENCOUNTER — Other Ambulatory Visit: Payer: Self-pay | Admitting: Internal Medicine

## 2022-10-03 DIAGNOSIS — R7989 Other specified abnormal findings of blood chemistry: Secondary | ICD-10-CM

## 2022-10-03 DIAGNOSIS — E782 Mixed hyperlipidemia: Secondary | ICD-10-CM

## 2022-10-03 DIAGNOSIS — R7303 Prediabetes: Secondary | ICD-10-CM

## 2022-10-03 LAB — MICROALBUMIN / CREATININE URINE RATIO
Creatinine,U: 56.1 mg/dL
Microalb Creat Ratio: 1.4 mg/g (ref 0.0–30.0)
Microalb, Ur: 0.8 mg/dL (ref 0.0–1.9)

## 2022-10-04 ENCOUNTER — Encounter: Payer: Self-pay | Admitting: Internal Medicine

## 2022-10-04 DIAGNOSIS — E291 Testicular hypofunction: Secondary | ICD-10-CM

## 2022-10-04 MED ORDER — TESTOSTERONE CYPIONATE 200 MG/ML IM SOLN: 100.0000 mg | INTRAMUSCULAR | 0 refills | Status: DC

## 2022-10-13 ENCOUNTER — Encounter: Payer: Self-pay | Admitting: Urology

## 2022-10-13 ENCOUNTER — Ambulatory Visit: Payer: BC Managed Care – PPO | Admitting: Urology

## 2022-10-14 LAB — COLOGUARD: COLOGUARD: NEGATIVE

## 2022-10-23 ENCOUNTER — Encounter: Payer: Self-pay | Admitting: Internal Medicine

## 2022-10-23 NOTE — Telephone Encounter (Signed)
PA testosterone

## 2022-10-30 ENCOUNTER — Other Ambulatory Visit (HOSPITAL_COMMUNITY): Payer: Self-pay

## 2022-10-30 ENCOUNTER — Telehealth: Payer: Self-pay

## 2022-10-30 NOTE — Telephone Encounter (Signed)
LVM to call back.

## 2022-10-30 NOTE — Telephone Encounter (Signed)
Mychart message sent to pt to advise Test. Cyp denied

## 2022-10-30 NOTE — Telephone Encounter (Signed)
noted 

## 2022-10-30 NOTE — Telephone Encounter (Signed)
Pharmacy Patient Advocate Encounter   Received notification that prior authorization for Testosterone Cypionate '200MG'$ /ML intramuscular solution is required/requested.  Per Test Claim: PA required   PA submitted on 10/30/22 to (ins) [employer] via CoverMyMeds [employer] or [facility]) confirmation # [id number] Status is pending

## 2022-10-30 NOTE — Telephone Encounter (Signed)
Resubmitted PA with different diagnosis.   Prior Authorization for Testosterone has been approved.    Effective dates: 10/30/22 through 10/29/2025   Approval letter attached to charts

## 2022-10-30 NOTE — Telephone Encounter (Signed)
Spoke to pt and informed him his testosterone has been approved

## 2022-10-30 NOTE — Telephone Encounter (Signed)
Pharmacy Patient Advocate Encounter  Received notification from [facility] [employer] that the request for prior authorization for Testosterone has been denied due to .    Please be advised we currently do not have a Pharmacist to review denials, therefore you will need to process appeals accordingly as needed. Thanks for your support at this time.   You may call [phone] or fax [phone], to appeal.

## 2022-11-15 ENCOUNTER — Ambulatory Visit: Payer: BC Managed Care – PPO | Admitting: Urology

## 2022-11-28 ENCOUNTER — Encounter: Payer: Self-pay | Admitting: Internal Medicine

## 2022-12-04 ENCOUNTER — Encounter: Payer: Self-pay | Admitting: Urology

## 2022-12-04 ENCOUNTER — Ambulatory Visit: Payer: BC Managed Care – PPO | Admitting: Urology

## 2022-12-04 VITALS — BP 128/85 | HR 83 | Ht 70.0 in | Wt 210.0 lb

## 2022-12-04 DIAGNOSIS — N401 Enlarged prostate with lower urinary tract symptoms: Secondary | ICD-10-CM

## 2022-12-04 DIAGNOSIS — E291 Testicular hypofunction: Secondary | ICD-10-CM | POA: Diagnosis not present

## 2022-12-04 DIAGNOSIS — N138 Other obstructive and reflux uropathy: Secondary | ICD-10-CM

## 2022-12-04 LAB — BLADDER SCAN AMB NON-IMAGING: Scan Result: 0

## 2022-12-04 MED ORDER — TAMSULOSIN HCL 0.4 MG PO CAPS: 0.4000 mg | ORAL_CAPSULE | Freq: Every day | ORAL | 3 refills | Status: DC

## 2022-12-04 NOTE — Progress Notes (Signed)
I, Timothy Hines,acting as a scribe for Timothy Altes, MD.,have documented all relevant documentation on the behalf of Timothy Altes, MD,as directed by  Timothy Altes, MD while in the presence of Timothy Altes, MD.   Timothy Hines,acting as a scribe for Timothy Altes, MD.,have documented all relevant documentation on the behalf of Timothy Altes, MD,as directed by  Timothy Altes, MD while in the presence of Timothy Altes, MD.   12/04/2022 2:29 PM   Timothy Hines [birth year] [id number]  Referring provider: Sherlene Shams, MD [address] [place],  [place] [address]  Chief Complaint  Patient presents with   Benign Prostatic Hypertrophy    Urologic history: 1.  History urinary retention -In conjunction with acute prostatitis January 2019  -Follow-up PVR 10/2017 233 mL -Started tamsulosin with improvement PVR   2.  Hypogonadism -On TRT managed by Dr. Darrick Hines    HPI: 57 y.o. male presents for annual follow-up.  PSA on 09/29/2022 was 1.56 Doing well since last visit No bothersome LUTS Denies dysuria, gross hematuria Denies flank, abdominal or pelvic pain He had knee surgery, and started Tamsulosin one month prior to his knee surgery, then discontinued it around October, but has not seen any worsening of his lower urinary tract symptoms. He notes occasional decreased stream, which is short term. He is on TRT by Dr. Darrick Hines. He has noted some increased tiredness and fatigue. Total testosterone level in February 2024 was 370. Free and bioavailable testosterone were in the low-normal range.   PMH: Past Medical History:  Diagnosis Date   Anxiety    GERD (gastroesophageal reflux disease)    Thyroid disease     Surgical History: Past Surgical History:  Procedure Laterality Date   KNEE ARTHROSCOPY Left 2013   NOSE SURGERY     SHOULDER SURGERY Left april 2016   THYROID SURGERY     TONSILLECTOMY  1989    Home Medications:  Allergies  as of 12/04/2022       Reactions   Levonorgestrel-ethinyl Estrad Other (See Comments)        Medication List        Accurate as of December 04, 2022  2:29 PM. If you have any questions, ask your nurse or doctor.          ALPRAZolam 0.5 MG tablet Commonly known as: XANAX Take 1 tablet (0.5 mg total) by mouth at bedtime as needed for anxiety.   cetirizine 10 MG tablet Commonly known as: ZYRTEC Take 10 mg by mouth daily.   fluticasone 50 MCG/ACT nasal spray Commonly known as: FLONASE Place 2 sprays into both nostrils daily. What changed:  when to take this reasons to take this   GLUCOSAMINE CHONDR COMPLEX PO Take by mouth daily.   levothyroxine 125 MCG tablet Commonly known as: SYNTHROID Take 125 mcg by mouth daily before breakfast.   levothyroxine 112 MCG tablet Commonly known as: SYNTHROID Take 112 mcg by mouth every other day.   levothyroxine 100 MCG tablet Commonly known as: SYNTHROID Take 100 mcg by mouth every other day.   Magnesium Oxide -Mg Supplement 500 MG Tabs Take 1 tablet by mouth daily.   meloxicam 7.5 MG tablet Commonly known as: MOBIC Take 1 tablet (7.5 mg total) by mouth 2 (two) times daily as needed for pain.   methocarbamol 500 MG tablet Commonly known as: ROBAXIN Take 1 tablet (500 mg total) by mouth 3 (three) times daily as  needed.   multivitamin capsule Take 1 capsule by mouth daily.   QUERCETIN COMPLEX IMMUNE PO Take by mouth.   sildenafil 20 MG tablet Commonly known as: REVATIO Take 20 mg by mouth once a week.   tamsulosin 0.4 MG Caps capsule Commonly known as: FLOMAX Take 1 capsule (0.4 mg total) by mouth daily.   testosterone cypionate 200 MG/ML injection Commonly known as: DEPOTESTOSTERONE CYPIONATE Inject 0.5 mLs (100 mg total) into the muscle every 14 (fourteen) days.   TURMERIC PO Take 1 capsule by mouth daily.   Vitamin D (Ergocalciferol) 50 MCG (2000 UT) Caps Take 2 capsules by mouth daily.   Zinc 25 MG  Tabs Take 1 tablet by mouth daily.        Allergies:  Allergies  Allergen Reactions   Levonorgestrel-Ethinyl Estrad Other (See Comments)    Social History:  reports that he has never smoked. He has never used smokeless tobacco. He reports current alcohol use of about 7.0 standard drinks of alcohol per week. He reports that he does not use drugs.   Physical Exam: BP 128/85   Pulse 83   Ht 5\' 10"  (1.778 m)   Wt 210 lb (95.3 kg)   BMI 30.13 kg/m   Constitutional:  Alert and oriented, No acute distress. HEENT: [place] AT, moist mucus membranes.  Trachea midline, no masses. Cardiovascular: No clubbing, cyanosis, or edema. Respiratory: Normal respiratory effort, no increased work of breathing. GU: Prostate 45 g, smooth without nodules. Neurologic: Grossly intact, no focal deficits, moving all 4 extremities. Psychiatric: Normal mood and affect.   Assessment & Plan:    1. BPH with LUTS PVR today was 0 mL. Presently off testosterone and doing well. We discussed that he may take Tamsulosin intermittently for worsening voiding symptoms. Refills sent to pharmacy. Continue annual follow up.  2. Hypogonadism Managed by Dr. Darrick Hines. We discussed relationship of TRT to BPH, and although there is a theoretical risk of worsening BPH with TRT, I informed him I rarely see men with BPH on testosterone who have worsening symptoms and in my opinion, it would be okay to increase his dose slightly.  I have reviewed the above documentation for accuracy and completeness, and I agree with the above.   Timothy Altes, MD  [facility] [address], [address] [place], [place] [address] [phone]

## 2022-12-29 ENCOUNTER — Ambulatory Visit: Payer: BC Managed Care – PPO | Admitting: Internal Medicine

## 2023-01-18 ENCOUNTER — Encounter: Payer: Self-pay | Admitting: Internal Medicine

## 2023-01-26 ENCOUNTER — Ambulatory Visit: Payer: BC Managed Care – PPO | Admitting: Internal Medicine

## 2023-03-07 ENCOUNTER — Encounter: Payer: Self-pay | Admitting: Internal Medicine

## 2023-03-07 ENCOUNTER — Ambulatory Visit: Payer: BC Managed Care – PPO | Admitting: Internal Medicine

## 2023-03-07 VITALS — BP 125/80 | HR 78 | Temp 98.4°F | Ht 70.0 in | Wt 217.4 lb

## 2023-03-07 DIAGNOSIS — E291 Testicular hypofunction: Secondary | ICD-10-CM | POA: Diagnosis not present

## 2023-03-07 DIAGNOSIS — R7303 Prediabetes: Secondary | ICD-10-CM | POA: Diagnosis not present

## 2023-03-07 DIAGNOSIS — R7989 Other specified abnormal findings of blood chemistry: Secondary | ICD-10-CM | POA: Diagnosis not present

## 2023-03-07 DIAGNOSIS — E782 Mixed hyperlipidemia: Secondary | ICD-10-CM | POA: Diagnosis not present

## 2023-03-07 DIAGNOSIS — F409 Phobic anxiety disorder, unspecified: Secondary | ICD-10-CM

## 2023-03-07 DIAGNOSIS — F5105 Insomnia due to other mental disorder: Secondary | ICD-10-CM

## 2023-03-07 DIAGNOSIS — R030 Elevated blood-pressure reading, without diagnosis of hypertension: Secondary | ICD-10-CM

## 2023-03-07 DIAGNOSIS — E890 Postprocedural hypothyroidism: Secondary | ICD-10-CM

## 2023-03-07 DIAGNOSIS — D751 Secondary polycythemia: Secondary | ICD-10-CM

## 2023-03-07 LAB — LIPID PANEL
Cholesterol: 253 mg/dL — ABNORMAL HIGH (ref 0–200)
HDL: 51.7 mg/dL (ref >39.00)
LDL Cholesterol: 179 mg/dL — ABNORMAL HIGH (ref 0–99)
NonHDL: 201.41
Total CHOL/HDL Ratio: 5
Triglycerides: 111 mg/dL (ref 0.0–149.0)
VLDL: 22.2 mg/dL (ref 0.0–40.0)

## 2023-03-07 LAB — CBC WITH DIFFERENTIAL/PLATELET
Basophils Absolute: 0.1 10*3/uL (ref 0.0–0.1)
Basophils Relative: 1.3 % (ref 0.0–3.0)
Eosinophils Absolute: 0 10*3/uL (ref 0.0–0.7)
Eosinophils Relative: 1 % (ref 0.0–5.0)
HCT: 44.6 % (ref 39.0–52.0)
Hemoglobin: 15.1 g/dL (ref 13.0–17.0)
Lymphocytes Relative: 23.3 % (ref 12.0–46.0)
Lymphs Abs: 1.2 10*3/uL (ref 0.7–4.0)
MCHC: 33.9 g/dL (ref 30.0–36.0)
MCV: 89.9 fl (ref 78.0–100.0)
Monocytes Absolute: 0.3 10*3/uL (ref 0.1–1.0)
Monocytes Relative: 5.3 % (ref 3.0–12.0)
Neutro Abs: 3.5 10*3/uL (ref 1.4–7.7)
Neutrophils Relative %: 69.1 % (ref 43.0–77.0)
Platelets: 365 10*3/uL (ref 150.0–400.0)
RBC: 4.97 Mil/uL (ref 4.22–5.81)
RDW: 13 % (ref 11.5–15.5)
WBC: 5 10*3/uL (ref 4.0–10.5)

## 2023-03-07 LAB — COMPREHENSIVE METABOLIC PANEL
ALT: 17 U/L (ref 0–53)
AST: 21 U/L (ref 0–37)
Albumin: 4.9 g/dL (ref 3.5–5.2)
Alkaline Phosphatase: 49 U/L (ref 39–117)
BUN: 21 mg/dL (ref 6–23)
CO2: 27 mEq/L (ref 19–32)
Calcium: 9.9 mg/dL (ref 8.4–10.5)
Chloride: 101 mEq/L (ref 96–112)
Creatinine, Ser: 0.99 mg/dL (ref 0.40–1.50)
GFR: 84.88 mL/min (ref >60.00)
Glucose, Bld: 99 mg/dL (ref 70–99)
Potassium: 5 mEq/L (ref 3.5–5.1)
Sodium: 137 mEq/L (ref 135–145)
Total Bilirubin: 1.1 mg/dL (ref 0.2–1.2)
Total Protein: 7.5 g/dL (ref 6.0–8.3)

## 2023-03-07 LAB — LDL CHOLESTEROL, DIRECT: Direct LDL: 173 mg/dL

## 2023-03-07 LAB — HEMOGLOBIN A1C: Hgb A1c MFr Bld: 6 % (ref 4.6–6.5)

## 2023-03-07 MED ORDER — ALPRAZOLAM 0.5 MG PO TABS: 0.5000 mg | ORAL_TABLET | Freq: Every evening | ORAL | 5 refills | Status: DC | PRN

## 2023-03-07 NOTE — Patient Instructions (Addendum)
Good to see you Timothy Hines!  Tell Steph hello for me and send me pottery pics!  1) BP goals 130/80 or less.    Check BP aft 48 hour suspenison of Aleve  ,  and send me readings   2)  check your supplements for BIOTIN.  ALL BIOTIN NEEDS TO BE SUSPENDED 72 HOURS PRIOR TO THYROID LABS

## 2023-03-07 NOTE — Assessment & Plan Note (Addendum)
Managed with biweekly  parenteral injections of testosterone   currently taking 100 mg   (0.5 ml)  every 14 days.  His last dose was 10 days ago.  Today's level is as expected   Lab Results  Component Value Date   TESTOSTERONE 93 (L) 03/07/2023

## 2023-03-07 NOTE — Assessment & Plan Note (Addendum)
Hgb has been normal for the pat 3 years.  Continue semi annual checks given use of testosterone  Lab Results  Component Value Date   WBC 5.0 03/07/2023   HGB 15.1 03/07/2023   HCT 44.6 03/07/2023   MCV 89.9 03/07/2023   PLT 365.0 03/07/2023

## 2023-03-07 NOTE — Assessment & Plan Note (Signed)
Managed with HIGH DOSES of levothyroxine (237 mcg alternating with 225 mcg ) .  Recent elevated TSH and low T3 noted.  Advised to review his many supplements for BIOTIN before making changes to regimen

## 2023-03-07 NOTE — Assessment & Plan Note (Addendum)
Home readings 130/80-85  but he  he has been using aleve and meloicam for jont issues.  Advised to check after 48 hour suspension

## 2023-03-07 NOTE — Assessment & Plan Note (Addendum)
Goal LDL is < 160 without diagnosis of HTN,  < 100 if he develops HTN.  Advised to try red yeast rice   Lab Results  Component Value Date   CHOL 253 (H) 03/07/2023   HDL 51.70 03/07/2023   LDLCALC 179 (H) 03/07/2023   LDLDIRECT 173.0 03/07/2023   TRIG 111.0 03/07/2023   CHOLHDL 5 03/07/2023

## 2023-03-07 NOTE — Progress Notes (Signed)
Subjective:  Patient ID: Timothy Hines, male    DOB: 1966-05-15  Age: 57 y.o. MRN: [id number]  CC: The primary encounter diagnosis was Hypogonadism in male. Diagnoses of Prediabetes, Moderate mixed hyperlipidemia not requiring statin therapy, Low testosterone in male, Elevated blood-pressure reading, without diagnosis of hypertension, Postsurgical hypothyroidism, Polycythemia, secondary, and Insomnia due to anxiety and fear were also pertinent to this visit.   HPI [place] presents for  Chief Complaint  Patient presents with   Medical Management of Chronic Issues    6 month follow up   1) elevated blood pressure:    Patient 's Home BP readings have been done about once per week and are  generally < 130/80 .  He  has been using  daily doses of NSAIDs.Marland Kitchen  He has been eartng more salt this summer.,   using salt on his tomato sandwiches and has been taking aleve for knee pain .    Hypogonadisim :  he is self administering parenteral testostoerone , last level normal in February   3) Hypothyroid, iatrogenic :  recent TSH was elevated despite extremely high daily dose of levothyroxine.   Managed by [facility] . May be taking biotin   4) Insomnia:  using alprazolam prn . Low dose    Outpatient Medications Prior to Visit  Medication Sig Dispense Refill   Bioflavonoid Products (QUERCETIN COMPLEX IMMUNE PO) Take by mouth.     cetirizine (ZYRTEC) 10 MG tablet Take 10 mg by mouth daily.     fluticasone (FLONASE) 50 MCG/ACT nasal spray Place 2 sprays into both nostrils daily. (Patient taking differently: Place 2 sprays into both nostrils as needed.) 16 g 6   Glucosamine-Chondroitin (GLUCOSAMINE CHONDR COMPLEX PO) Take by mouth daily.     levothyroxine (SYNTHROID) 100 MCG tablet Take 100 mcg by mouth every other day.     levothyroxine (SYNTHROID) 112 MCG tablet Take 112 mcg by mouth every other day.     levothyroxine (SYNTHROID) 125 MCG tablet Take 125 mcg by mouth daily before  breakfast.     Magnesium Oxide 500 MG TABS Take 1 tablet by mouth daily.     meloxicam (MOBIC) 7.5 MG tablet Take 1 tablet (7.5 mg total) by mouth 2 (two) times daily as needed for pain. 60 tablet 11   methocarbamol (ROBAXIN) 500 MG tablet Take 1 tablet (500 mg total) by mouth 3 (three) times daily as needed. 90 tablet 0   Multiple Vitamin (MULTIVITAMIN) capsule Take 1 capsule by mouth daily.     testosterone cypionate (DEPOTESTOSTERONE CYPIONATE) 200 MG/ML injection Inject 0.5 mLs (100 mg total) into the muscle every 14 (fourteen) days. 10 mL 0   TURMERIC PO Take 1 capsule by mouth daily.     Vitamin D, Ergocalciferol, 50 MCG (2000 UT) CAPS Take 2 capsules by mouth daily.     Zinc 25 MG TABS Take 1 tablet by mouth daily.     ALPRAZolam (XANAX) 0.5 MG tablet Take 1 tablet (0.5 mg total) by mouth at bedtime as needed for anxiety. 30 tablet 0   sildenafil (REVATIO) 20 MG tablet Take 20 mg by mouth once a week. (Patient not taking: Reported on 03/07/2023)     tamsulosin (FLOMAX) 0.4 MG CAPS capsule Take 1 capsule (0.4 mg total) by mouth daily. (Patient not taking: Reported on 03/07/2023) 90 capsule 3   No facility-administered medications prior to visit.    Review of Systems;  Patient denies headache, fevers, malaise, unintentional weight loss, skin rash,  eye pain, sinus congestion and sinus pain, sore throat, dysphagia,  hemoptysis , cough, dyspnea, wheezing, chest pain, palpitations, orthopnea, edema, abdominal pain, nausea, melena, diarrhea, constipation, flank pain, dysuria, hematuria, urinary  Frequency, nocturia, numbness, tingling, seizures,  Focal weakness, Loss of consciousness,  Tremor, insomnia, depression, anxiety, and suicidal ideation.      Objective:  BP 125/80   Pulse 78   Temp 98.4 F (36.9 C) (Oral)   Ht 5\' 10"  (1.778 m)   Wt 217 lb 6.4 oz (98.6 kg)   SpO2 95%   BMI 31.19 kg/m   BP Readings from Last 3 Encounters:  03/07/23 125/80  12/04/22 128/85  09/29/22 124/86     Wt Readings from Last 3 Encounters:  03/07/23 217 lb 6.4 oz (98.6 kg)  12/04/22 210 lb (95.3 kg)  09/29/22 214 lb 12.8 oz (97.4 kg)    Physical Exam Vitals reviewed.  Constitutional:      General: He is not in acute distress.    Appearance: Normal appearance. He is well-developed and normal weight. He is not ill-appearing, toxic-appearing or diaphoretic.     Comments: Extremely muscle bound   HENT:     Head: Normocephalic.  Eyes:     General: No scleral icterus.       Right eye: No discharge.        Left eye: No discharge.     Conjunctiva/sclera: Conjunctivae normal.  Cardiovascular:     Rate and Rhythm: Normal rate and regular rhythm.     Heart sounds: Normal heart sounds.  Pulmonary:     Effort: Pulmonary effort is normal. No respiratory distress.     Breath sounds: Normal breath sounds.  Musculoskeletal:        General: Normal range of motion.     Cervical back: Normal range of motion.  Skin:    General: Skin is warm and dry.  Neurological:     General: No focal deficit present.     Mental Status: He is alert and oriented to person, place, and time. Mental status is at baseline.  Psychiatric:        Mood and Affect: Mood normal.        Behavior: Behavior normal.        Thought Content: Thought content normal.        Judgment: Judgment normal.    Lab Results  Component Value Date   HGBA1C 6.0 03/07/2023   HGBA1C 6.0 (H) 09/29/2022   HGBA1C 5.7 04/14/2021    Lab Results  Component Value Date   CREATININE 0.99 03/07/2023   CREATININE 0.92 09/29/2022   CREATININE 1.09 04/14/2021    Lab Results  Component Value Date   WBC 5.0 03/07/2023   HGB 15.1 03/07/2023   HCT 44.6 03/07/2023   PLT 365.0 03/07/2023   GLUCOSE 99 03/07/2023   CHOL 253 (H) 03/07/2023   TRIG 111.0 03/07/2023   HDL 51.70 03/07/2023   LDLDIRECT 173.0 03/07/2023   LDLCALC 179 (H) 03/07/2023   ALT 17 03/07/2023   AST 21 03/07/2023   NA 137 03/07/2023   K 5.0 03/07/2023   CL 101  03/07/2023   CREATININE 0.99 03/07/2023   BUN 21 03/07/2023   CO2 27 03/07/2023   TSH 3.17 09/29/2022   PSA 1.56 09/29/2022   HGBA1C 6.0 03/07/2023   MICROALBUR 0.8 10/02/2022    DG Chest 2 View  Result Date: 12/16/2017 CLINICAL DATA:  Productive cough for 3 weeks. EXAM: CHEST - 2 VIEW COMPARISON:  06/08/2014 FINDINGS:  The heart size and mediastinal contours are within normal limits. Both lungs are clear. Stable mild thoracic and lumbar spine degenerative disc disease. IMPRESSION: Stable exam.  No active cardiopulmonary disease. Electronically Signed   By: Myles Rosenthal M.D.   On: 12/16/2017 12:08    Assessment & Plan:  .Hypogonadism in male Assessment & Plan: Managed with biweekly  parenteral injections of testosterone   currently taking 100 mg   (0.5 ml)  every 14 days.  His last dose was 10 days ago.  Today's level is as expected   Lab Results  Component Value Date   TESTOSTERONE 93 (L) 03/07/2023      Prediabetes -     Comprehensive metabolic panel -     Hemoglobin A1c  Moderate mixed hyperlipidemia not requiring statin therapy Assessment & Plan: Goal LDL is < 160 without diagnosis of HTN,  < 100 if he develops HTN.  Advised to try red yeast rice   Lab Results  Component Value Date   CHOL 253 (H) 03/07/2023   HDL 51.70 03/07/2023   LDLCALC 179 (H) 03/07/2023   LDLDIRECT 173.0 03/07/2023   TRIG 111.0 03/07/2023   CHOLHDL 5 03/07/2023     Orders: -     Lipid panel -     LDL cholesterol, direct  Low testosterone in male -     CBC with Differential/Platelet -     Testosterone,Free and Total  Elevated blood-pressure reading, without diagnosis of hypertension Assessment & Plan: Home readings 130/80-85  but he  he has been using aleve and meloicam for jont issues.  Advised to check after 48 hour suspension    Postsurgical hypothyroidism Assessment & Plan: Managed with HIGH DOSES of levothyroxine (237 mcg alternating with 225 mcg ) .  Recent elevated TSH and  low T3 noted.  Advised to review his many supplements for BIOTIN before making changes to regimen   Polycythemia, secondary Assessment & Plan: Hgb has been normal for the pat 3 years.  Continue semi annual checks given use of testosterone  Lab Results  Component Value Date   WBC 5.0 03/07/2023   HGB 15.1 03/07/2023   HCT 44.6 03/07/2023   MCV 89.9 03/07/2023   PLT 365.0 03/07/2023      Insomnia due to anxiety and fear Assessment & Plan: Managed with prn use of alprazolam. The risks and benefits of benzodiazepine use were reviewed with patient today including excessive sedation leading to respiratory depression,  impaired thinking/driving, and addiction.  Patient was advised to avoid concurrent use with alcohol, to use medication only as needed and not to share with others  .     Other orders -     ALPRAZolam; Take 1 tablet (0.5 mg total) by mouth at bedtime as needed for anxiety.  Dispense: 30 tablet; Refill: 5     I provided 43  minutes on the day of this visit which included  face-to-face time with patient ,  in review of most recent visit with cENT,  recent surgical and non surgical procedures, previous  labs and imaging studies, counseling on currently addressed issues,  and post visit ordering to diagnostics and therapeutics .   Follow-up: Return in about 6 months (around 09/07/2023).   Sherlene Shams, MD

## 2023-03-09 LAB — TESTOSTERONE,FREE AND TOTAL
Testosterone, Free: 4.2 pg/mL — ABNORMAL LOW (ref 7.2–24.0)
Testosterone: 93 ng/dL — ABNORMAL LOW (ref 264–916)

## 2023-03-09 NOTE — Assessment & Plan Note (Signed)
Managed with prn use of alprazolam. The risks and benefits of benzodiazepine use were reviewed with patient today including excessive sedation leading to respiratory depression,  impaired thinking/driving, and addiction.  Patient was advised to avoid concurrent use with alcohol, to use medication only as needed and not to share with others  .   

## 2023-03-14 ENCOUNTER — Encounter: Payer: Self-pay | Admitting: Internal Medicine

## 2023-03-15 ENCOUNTER — Other Ambulatory Visit: Payer: Self-pay | Admitting: Internal Medicine

## 2023-03-15 DIAGNOSIS — E291 Testicular hypofunction: Secondary | ICD-10-CM

## 2023-03-15 MED ORDER — TESTOSTERONE CYPIONATE 200 MG/ML IM SOLN: 100.0000 mg | INTRAMUSCULAR | 0 refills | Status: DC

## 2023-05-09 ENCOUNTER — Ambulatory Visit: Payer: BC Managed Care – PPO | Admitting: Internal Medicine

## 2023-11-19 ENCOUNTER — Other Ambulatory Visit: Payer: Self-pay | Admitting: Internal Medicine

## 2023-11-19 DIAGNOSIS — E291 Testicular hypofunction: Secondary | ICD-10-CM

## 2023-11-19 DIAGNOSIS — R7301 Impaired fasting glucose: Secondary | ICD-10-CM

## 2023-11-19 DIAGNOSIS — R030 Elevated blood-pressure reading, without diagnosis of hypertension: Secondary | ICD-10-CM

## 2023-11-19 DIAGNOSIS — E7800 Pure hypercholesterolemia, unspecified: Secondary | ICD-10-CM

## 2023-11-19 DIAGNOSIS — D751 Secondary polycythemia: Secondary | ICD-10-CM

## 2023-11-19 DIAGNOSIS — Z125 Encounter for screening for malignant neoplasm of prostate: Secondary | ICD-10-CM

## 2023-11-19 NOTE — Telephone Encounter (Signed)
 Patient is overdue for fasting labs and 6 month follow up Please schedule him an  appt for fasting labs and OV to follow Please notify patient that the prescription  was Refilled for 30 days only because it has been  8 months since last visit. Marland Kitchen  OFFICE VISIT NEEDED prior to any more refills

## 2023-12-06 ENCOUNTER — Ambulatory Visit: Payer: Self-pay | Admitting: Urology

## 2023-12-07 ENCOUNTER — Ambulatory Visit: Payer: BC Managed Care – PPO | Admitting: Urology

## 2023-12-12 ENCOUNTER — Ambulatory Visit: Payer: Self-pay | Admitting: Urology

## 2023-12-12 ENCOUNTER — Encounter: Payer: Self-pay | Admitting: Urology

## 2023-12-12 VITALS — BP 169/112 | HR 111 | Ht 71.0 in | Wt 210.0 lb

## 2023-12-12 DIAGNOSIS — R399 Unspecified symptoms and signs involving the genitourinary system: Secondary | ICD-10-CM

## 2023-12-12 DIAGNOSIS — N401 Enlarged prostate with lower urinary tract symptoms: Secondary | ICD-10-CM

## 2023-12-12 DIAGNOSIS — E291 Testicular hypofunction: Secondary | ICD-10-CM

## 2023-12-12 DIAGNOSIS — Z125 Encounter for screening for malignant neoplasm of prostate: Secondary | ICD-10-CM

## 2023-12-12 DIAGNOSIS — N138 Other obstructive and reflux uropathy: Secondary | ICD-10-CM | POA: Diagnosis not present

## 2023-12-12 LAB — BLADDER SCAN AMB NON-IMAGING: Scan Result: 15

## 2023-12-13 ENCOUNTER — Encounter: Payer: Self-pay | Admitting: Urology

## 2023-12-13 LAB — TESTOSTERONE: Testosterone: 271 ng/dL (ref 264–916)

## 2023-12-14 NOTE — Progress Notes (Signed)
 I, Maysun Jamey Mccallum, acting as a scribe for Geraline Knapp, MD., have documented all relevant documentation on the behalf of Geraline Knapp, MD, as directed by Geraline Knapp, MD while in the presence of Geraline Knapp, MD.  12/12/2023 3:46 PM   Timothy Hines 13-May-1966 [id number]  Referring provider: Thersia Flax, MD [address] [place],  [place] [address]  Chief Complaint  Patient presents with   Benign Prostatic Hypertrophy   Urologic history: 1.  History urinary retention In conjunction with acute prostatitis January 2019  Follow-up PVR 10/2017 233 mL -Started tamsulosin  with improvement PVR   2.  Hypogonadism On TRT managed by Dr. Madelon Scheuermann  HPI: Timothy Hines is a 58 y.o. male presents for annual follow-up.  5-6 weeks ago, he had onset of urinary frequency, urgency, weak urinary stream, and mild penile discomfort. His symptoms persisted with intermittent severity, and he started tamsulosin  3 days ago with marked improvement in his lower urinary tract symptoms. Remains on TRT, but does note increased tiredness and fatigue. He states a mid-cycle level July 2024 was 93. He was administering 100 mg every 2 weeks. He was having significant symptoms toward the end of his 2 week cycle and recently changed his dose to 50 mg weekly, which is better but still with tiredness and fatigue. His last injection was 2 weeks ago Last PSA was February 2024 at 1.56  PSA trend   PSA  Latest Ref Rng < OR = 4.00 ng/mL  05/05/2020 1.38   04/14/2021 1.16   09/29/2022 1.56      PMH: Past Medical History:  Diagnosis Date   Anxiety    GERD (gastroesophageal reflux disease)    Thyroid  disease     Surgical History: Past Surgical History:  Procedure Laterality Date   KNEE ARTHROSCOPY Left 2013   NOSE SURGERY     SHOULDER SURGERY Left april 2016   THYROID  SURGERY     TONSILLECTOMY  1989    Home Medications:  Allergies as of 12/12/2023   No Known  Allergies      Medication List        Accurate as of December 12, 2023 11:59 PM. If you have any questions, ask your nurse or doctor.          ALPRAZolam  0.5 MG tablet Commonly known as: XANAX  Take 1 tablet (0.5 mg total) by mouth at bedtime as needed for anxiety.   cetirizine 10 MG tablet Commonly known as: ZYRTEC Take 10 mg by mouth daily.   fluticasone  50 MCG/ACT nasal spray Commonly known as: FLONASE  Place 2 sprays into both nostrils daily. What changed:  when to take this reasons to take this   GLUCOSAMINE CHONDR COMPLEX PO Take by mouth daily.   levothyroxine 125 MCG tablet Commonly known as: SYNTHROID Take 125 mcg by mouth daily before breakfast.   levothyroxine 112 MCG tablet Commonly known as: SYNTHROID Take 112 mcg by mouth every other day.   levothyroxine 100 MCG tablet Commonly known as: SYNTHROID Take 100 mcg by mouth every other day.   Magnesium Oxide -Mg Supplement 500 MG Tabs Take 1 tablet by mouth daily.   meloxicam  7.5 MG tablet Commonly known as: MOBIC  Take 1 tablet (7.5 mg total) by mouth 2 (two) times daily as needed for pain.   methocarbamol  500 MG tablet Commonly known as: ROBAXIN  Take 1 tablet (500 mg total) by mouth 3 (three) times daily as needed.   multivitamin capsule  Take 1 capsule by mouth daily.   QUERCETIN COMPLEX IMMUNE PO Take by mouth.   testosterone  cypionate 200 MG/ML injection Commonly known as: DEPOTESTOSTERONE CYPIONATE Inject 0.5 mLs (100 mg total) into the muscle every 14 (fourteen) days.   TURMERIC PO Take 1 capsule by mouth daily.   Vitamin D  (Ergocalciferol ) 50 MCG (2000 UT) Caps Take 2 capsules by mouth daily.   Zinc 25 MG Tabs Take 1 tablet by mouth daily.        Allergies: No Known Allergies  Social History:  reports that he has never smoked. He has never used smokeless tobacco. He reports current alcohol use of about 7.0 standard drinks of alcohol per week. He reports that he does not use  drugs.   Physical Exam: BP (!) 169/112   Pulse (!) 111   Ht 5\' 11"  (1.803 m)   Wt 210 lb (95.3 kg)   BMI 29.29 kg/m   Constitutional:  Alert and oriented, No acute distress. HEENT: [place] AT Respiratory: Normal respiratory effort, no increased work of breathing. Psychiatric: Normal mood and affect.   Assessment & Plan:    1. Lower urinary tract symptoms Bothersome obstructive voiding symptoms with mild penile discomfort starting 5-6 weeks ago, most likely secondary inflammatory prostatitis.  Symptoms significantly improved on tamsulosin , which he will continue for the next month.  2. Hypogonadism Persistent tiredness and fatigue. He inquired if I would manage his low T, which I agreed. Since it has been 2 weeks since his last injection, a testosterone  level is drawn today. Will restart at 100 mg weekly and obtain a cycle testosterone  level in 1 month.  If follow-up testosterone  level acceptable. Will check labs every 6 months and continue annual office visits.  3. Prostate cancer screening Last PSA February 2024, and he will follow up and will check a PSA when his testosterone  level is checked.  I have reviewed the above documentation for accuracy and completeness, and I agree with the above.   Geraline Knapp, MD  [facility] [address], [address] [place], [place] [address] [phone]

## 2024-01-18 ENCOUNTER — Encounter: Payer: Self-pay | Admitting: Urology

## 2024-01-18 ENCOUNTER — Other Ambulatory Visit

## 2024-01-18 DIAGNOSIS — Z125 Encounter for screening for malignant neoplasm of prostate: Secondary | ICD-10-CM

## 2024-01-18 DIAGNOSIS — E291 Testicular hypofunction: Secondary | ICD-10-CM

## 2024-01-19 LAB — TESTOSTERONE: Testosterone: 513 ng/dL (ref 264–916)

## 2024-01-19 LAB — PSA: Prostate Specific Ag, Serum: 4.9 ng/mL — ABNORMAL HIGH (ref 0.0–4.0)

## 2024-01-20 ENCOUNTER — Ambulatory Visit: Payer: Self-pay | Admitting: Urology

## 2024-01-21 ENCOUNTER — Other Ambulatory Visit: Payer: Self-pay

## 2024-01-21 DIAGNOSIS — R972 Elevated prostate specific antigen [PSA]: Secondary | ICD-10-CM

## 2024-01-22 ENCOUNTER — Other Ambulatory Visit

## 2024-01-22 DIAGNOSIS — R972 Elevated prostate specific antigen [PSA]: Secondary | ICD-10-CM

## 2024-01-22 LAB — URINALYSIS, COMPLETE
Bilirubin, UA: NEGATIVE
Glucose, UA: NEGATIVE
Ketones, UA: NEGATIVE
Leukocytes,UA: NEGATIVE
Nitrite, UA: NEGATIVE
Protein,UA: NEGATIVE
RBC, UA: NEGATIVE
Specific Gravity, UA: 1.015 (ref 1.005–1.030)
Urobilinogen, Ur: 0.2 mg/dL (ref 0.2–1.0)
pH, UA: 7 (ref 5.0–7.5)

## 2024-01-22 LAB — MICROSCOPIC EXAMINATION

## 2024-02-07 ENCOUNTER — Encounter: Payer: Self-pay | Admitting: Urology

## 2024-02-08 ENCOUNTER — Other Ambulatory Visit: Payer: Self-pay | Admitting: Urology

## 2024-02-08 DIAGNOSIS — E291 Testicular hypofunction: Secondary | ICD-10-CM

## 2024-02-08 MED ORDER — TESTOSTERONE CYPIONATE 200 MG/ML IM SOLN
100.0000 mg | INTRAMUSCULAR | 0 refills | Status: DC
Start: 2024-02-08 — End: 2024-03-21

## 2024-02-08 MED ORDER — TAMSULOSIN HCL 0.4 MG PO CAPS
0.4000 mg | ORAL_CAPSULE | Freq: Every day | ORAL | 0 refills | Status: DC
Start: 2024-02-08 — End: 2024-06-30

## 2024-03-13 ENCOUNTER — Other Ambulatory Visit

## 2024-03-13 DIAGNOSIS — E291 Testicular hypofunction: Secondary | ICD-10-CM

## 2024-03-15 ENCOUNTER — Encounter: Payer: Self-pay | Admitting: Urology

## 2024-03-15 LAB — TESTOSTERONE,FREE AND TOTAL
Testosterone, Free: 34.8 pg/mL — ABNORMAL HIGH (ref 7.2–24.0)
Testosterone: 1000 ng/dL — ABNORMAL HIGH (ref 264–916)

## 2024-03-16 ENCOUNTER — Ambulatory Visit: Payer: Self-pay | Admitting: Internal Medicine

## 2024-03-21 ENCOUNTER — Other Ambulatory Visit: Payer: Self-pay | Admitting: Urology

## 2024-03-21 DIAGNOSIS — E291 Testicular hypofunction: Secondary | ICD-10-CM

## 2024-03-21 MED ORDER — TESTOSTERONE CYPIONATE 200 MG/ML IM SOLN: 70.0000 mg | INTRAMUSCULAR | Status: DC

## 2024-03-24 ENCOUNTER — Other Ambulatory Visit: Payer: Self-pay

## 2024-03-24 DIAGNOSIS — R972 Elevated prostate specific antigen [PSA]: Secondary | ICD-10-CM

## 2024-03-24 DIAGNOSIS — E291 Testicular hypofunction: Secondary | ICD-10-CM

## 2024-04-14 ENCOUNTER — Encounter: Payer: Self-pay | Admitting: Urology

## 2024-04-18 ENCOUNTER — Other Ambulatory Visit

## 2024-04-18 DIAGNOSIS — E291 Testicular hypofunction: Secondary | ICD-10-CM

## 2024-04-18 DIAGNOSIS — R972 Elevated prostate specific antigen [PSA]: Secondary | ICD-10-CM

## 2024-04-19 ENCOUNTER — Ambulatory Visit: Payer: Self-pay | Admitting: Urology

## 2024-04-19 LAB — TESTOSTERONE: Testosterone: 583 ng/dL (ref 264–916)

## 2024-04-19 LAB — PSA: Prostate Specific Ag, Serum: 3.1 ng/mL (ref 0.0–4.0)

## 2024-06-30 ENCOUNTER — Encounter: Payer: Self-pay | Admitting: Urology

## 2024-06-30 ENCOUNTER — Other Ambulatory Visit: Payer: Self-pay | Admitting: *Deleted

## 2024-06-30 MED ORDER — TAMSULOSIN HCL 0.4 MG PO CAPS: 0.4000 mg | ORAL_CAPSULE | Freq: Every day | ORAL | 3 refills | Status: AC

## 2024-07-11 ENCOUNTER — Other Ambulatory Visit: Payer: Self-pay | Admitting: Urology

## 2024-07-11 DIAGNOSIS — E291 Testicular hypofunction: Secondary | ICD-10-CM

## 2024-08-11 ENCOUNTER — Other Ambulatory Visit: Payer: Self-pay

## 2024-08-11 DIAGNOSIS — Z125 Encounter for screening for malignant neoplasm of prostate: Secondary | ICD-10-CM

## 2024-08-11 DIAGNOSIS — E291 Testicular hypofunction: Secondary | ICD-10-CM

## 2024-08-11 DIAGNOSIS — R972 Elevated prostate specific antigen [PSA]: Secondary | ICD-10-CM

## 2024-08-12 ENCOUNTER — Other Ambulatory Visit

## 2024-08-12 DIAGNOSIS — R972 Elevated prostate specific antigen [PSA]: Secondary | ICD-10-CM

## 2024-08-12 DIAGNOSIS — E291 Testicular hypofunction: Secondary | ICD-10-CM

## 2024-08-12 DIAGNOSIS — Z125 Encounter for screening for malignant neoplasm of prostate: Secondary | ICD-10-CM

## 2024-08-13 LAB — HEMOGLOBIN AND HEMATOCRIT, BLOOD
Hematocrit: 53.4 % — ABNORMAL HIGH (ref 37.5–51.0)
Hemoglobin: 17.2 g/dL (ref 13.0–17.7)

## 2024-08-13 LAB — TESTOSTERONE: Testosterone: 418 ng/dL (ref 264–916)

## 2024-08-13 LAB — PSA: Prostate Specific Ag, Serum: 1.8 ng/mL (ref 0.0–4.0)

## 2024-08-15 ENCOUNTER — Ambulatory Visit: Payer: Self-pay | Admitting: Urology

## 2024-08-18 ENCOUNTER — Encounter: Payer: Self-pay | Admitting: *Deleted

## 2025-02-09 ENCOUNTER — Other Ambulatory Visit

## 2025-02-11 ENCOUNTER — Ambulatory Visit: Admitting: Urology
